# Patient Record
Sex: Female | Born: 1970 | Race: White | Hispanic: No | Marital: Single | Smoking: Never smoker
Health system: Southern US, Community
[De-identification: ages and names within clinical notes are randomized; demographics above are authoritative.]

## PROBLEM LIST (undated history)

## (undated) DIAGNOSIS — D682 Hereditary deficiency of other clotting factors: Secondary | ICD-10-CM

## (undated) DIAGNOSIS — G43909 Migraine, unspecified, not intractable, without status migrainosus: Secondary | ICD-10-CM

## (undated) HISTORY — PX: TUBAL LIGATION: SHX77

---

## 2010-03-26 ENCOUNTER — Emergency Department (HOSPITAL_COMMUNITY): Admission: EM | Admit: 2010-03-26 | Discharge: 2010-03-26 | Payer: Self-pay | Admitting: Emergency Medicine

## 2011-01-27 LAB — POCT CARDIAC MARKERS
Myoglobin, poc: 59.2 ng/mL (ref 12–200)
Troponin i, poc: <0.05 ng/mL (ref 0.00–0.09)

## 2011-05-27 ENCOUNTER — Emergency Department: Payer: Self-pay | Admitting: Emergency Medicine

## 2013-04-27 ENCOUNTER — Ambulatory Visit: Payer: Self-pay | Admitting: Family Medicine

## 2014-04-14 ENCOUNTER — Emergency Department: Payer: Self-pay | Admitting: Emergency Medicine

## 2014-04-26 ENCOUNTER — Emergency Department: Payer: Self-pay | Admitting: Emergency Medicine

## 2014-04-26 LAB — URINALYSIS, COMPLETE
Bilirubin,UR: NEGATIVE
Blood: NEGATIVE
Glucose,UR: NEGATIVE mg/dL (ref 0–75)
KETONE: NEGATIVE
Leukocyte Esterase: NEGATIVE
NITRITE: NEGATIVE
PH: 6 (ref 4.5–8.0)
Protein: NEGATIVE
SPECIFIC GRAVITY: 1.016 (ref 1.003–1.030)
Squamous Epithelial: 6

## 2014-04-26 LAB — COMPREHENSIVE METABOLIC PANEL
Albumin: 3.4 g/dL (ref 3.4–5.0)
Alkaline Phosphatase: 82 U/L
Anion Gap: 9 (ref 7–16)
BUN: 10 mg/dL (ref 7–18)
Bilirubin,Total: 1.9 mg/dL — ABNORMAL HIGH (ref 0.2–1.0)
CALCIUM: 8.5 mg/dL (ref 8.5–10.1)
CHLORIDE: 101 mmol/L (ref 98–107)
CO2: 26 mmol/L (ref 21–32)
CREATININE: 0.86 mg/dL (ref 0.60–1.30)
EGFR (Non-African Amer.): >60
GLUCOSE: 141 mg/dL — AB (ref 65–99)
Osmolality: 273 (ref 275–301)
POTASSIUM: 3.3 mmol/L — AB (ref 3.5–5.1)
SGOT(AST): 50 U/L — ABNORMAL HIGH (ref 15–37)
SGPT (ALT): 77 U/L (ref 12–78)
Sodium: 136 mmol/L (ref 136–145)
Total Protein: 7 g/dL (ref 6.4–8.2)

## 2014-04-26 LAB — CBC WITH DIFFERENTIAL/PLATELET
BASOS ABS: 0.1 10*3/uL (ref 0.0–0.1)
Basophil %: 0.8 %
Eosinophil #: 0.1 10*3/uL (ref 0.0–0.7)
Eosinophil %: 0.7 %
HCT: 41.2 % (ref 35.0–47.0)
HGB: 13.5 g/dL (ref 12.0–16.0)
Lymphocyte #: 1.9 10*3/uL (ref 1.0–3.6)
Lymphocyte %: 24.8 %
MCH: 30.1 pg (ref 26.0–34.0)
MCHC: 32.8 g/dL (ref 32.0–36.0)
MCV: 92 fL (ref 80–100)
MONO ABS: 0.5 x10 3/mm (ref 0.2–0.9)
Monocyte %: 6 %
NEUTROS ABS: 5.2 10*3/uL (ref 1.4–6.5)
Neutrophil %: 67.7 %
PLATELETS: 195 10*3/uL (ref 150–440)
RBC: 4.5 10*6/uL (ref 3.80–5.20)
RDW: 14.2 % (ref 11.5–14.5)
WBC: 7.7 10*3/uL (ref 3.6–11.0)

## 2014-04-26 LAB — PREGNANCY, URINE: Pregnancy Test, Urine: NEGATIVE m[IU]/mL

## 2014-04-27 LAB — CBC WITH DIFFERENTIAL/PLATELET
BASOS ABS: 0.1 10*3/uL (ref 0.0–0.1)
Basophil %: 1.8 %
EOS PCT: 0.2 %
Eosinophil #: 0 10*3/uL (ref 0.0–0.7)
HCT: 36.9 % (ref 35.0–47.0)
HGB: 12.7 g/dL (ref 12.0–16.0)
LYMPHS PCT: 39.8 %
Lymphocyte #: 2.9 10*3/uL (ref 1.0–3.6)
MCH: 31.2 pg (ref 26.0–34.0)
MCHC: 34.4 g/dL (ref 32.0–36.0)
MCV: 91 fL (ref 80–100)
MONO ABS: 0.3 x10 3/mm (ref 0.2–0.9)
Monocyte %: 4.4 %
NEUTROS PCT: 53.8 %
Neutrophil #: 3.9 10*3/uL (ref 1.4–6.5)
Platelet: 161 10*3/uL (ref 150–440)
RBC: 4.06 10*6/uL (ref 3.80–5.20)
RDW: 14.3 % (ref 11.5–14.5)
WBC: 7.3 10*3/uL (ref 3.6–11.0)

## 2014-04-28 ENCOUNTER — Observation Stay: Payer: Self-pay | Admitting: Internal Medicine

## 2014-04-28 LAB — MONONUCLEOSIS SCREEN: MONO TEST: NEGATIVE

## 2014-04-28 LAB — CSF CELL CT + PROT + GLU PANEL
CSF TUBE #: 3
Eosinophil: 0 %
GLUCOSE, CSF: 68 mg/dL (ref 40–75)
Lymphocytes: 0 %
Monocytes/Macrophages: 0 %
Neutrophils: 100 %
Other Cells: 0 %
PROTEIN, CSF: 34 mg/dL (ref 15–45)
RBC (CSF): 0 /mm3
WBC (CSF): 3 /mm3

## 2014-04-28 LAB — HEMOGLOBIN A1C: Hemoglobin A1C: 5.6 % (ref 4.2–6.3)

## 2014-04-28 LAB — COMPREHENSIVE METABOLIC PANEL
ALT: 124 U/L — AB (ref 12–78)
ANION GAP: 7 (ref 7–16)
Albumin: 3.2 g/dL — ABNORMAL LOW (ref 3.4–5.0)
Alkaline Phosphatase: 98 U/L
BUN: 7 mg/dL (ref 7–18)
Bilirubin,Total: 2.4 mg/dL — ABNORMAL HIGH (ref 0.2–1.0)
CO2: 26 mmol/L (ref 21–32)
CREATININE: 0.78 mg/dL (ref 0.60–1.30)
Calcium, Total: 8.1 mg/dL — ABNORMAL LOW (ref 8.5–10.1)
Chloride: 105 mmol/L (ref 98–107)
EGFR (African American): >60
EGFR (Non-African Amer.): >60
GLUCOSE: 102 mg/dL — AB (ref 65–99)
Osmolality: 274 (ref 275–301)
POTASSIUM: 3.7 mmol/L (ref 3.5–5.1)
SGOT(AST): 102 U/L — ABNORMAL HIGH (ref 15–37)
SODIUM: 138 mmol/L (ref 136–145)
TOTAL PROTEIN: 6.4 g/dL (ref 6.4–8.2)

## 2014-04-28 LAB — LIPASE, BLOOD: Lipase: 108 U/L (ref 73–393)

## 2014-04-28 LAB — URINE CULTURE

## 2014-04-28 LAB — RAPID HIV-1/2 QL/CONFIRM: HIV-1/2,Rapid Ql: NEGATIVE

## 2014-04-29 LAB — BASIC METABOLIC PANEL
Anion Gap: 7 (ref 7–16)
BUN: 6 mg/dL — ABNORMAL LOW (ref 7–18)
Calcium, Total: 8.2 mg/dL — ABNORMAL LOW (ref 8.5–10.1)
Chloride: 103 mmol/L (ref 98–107)
Co2: 28 mmol/L (ref 21–32)
Creatinine: 0.72 mg/dL (ref 0.60–1.30)
EGFR (African American): >60
EGFR (Non-African Amer.): >60
Glucose: 112 mg/dL — ABNORMAL HIGH (ref 65–99)
Osmolality: 274 (ref 275–301)
Potassium: 3.7 mmol/L (ref 3.5–5.1)
Sodium: 138 mmol/L (ref 136–145)

## 2014-04-29 LAB — CBC WITH DIFFERENTIAL/PLATELET
COMMENT - H1-COM1: NORMAL
EOS PCT: 1 %
HCT: 36.1 % (ref 35.0–47.0)
HGB: 12.2 g/dL (ref 12.0–16.0)
LYMPHS PCT: 31 %
MCH: 31.1 pg (ref 26.0–34.0)
MCHC: 33.9 g/dL (ref 32.0–36.0)
MCV: 92 fL (ref 80–100)
MONOS PCT: 3 %
PLATELETS: 152 10*3/uL (ref 150–440)
RBC: 3.93 10*6/uL (ref 3.80–5.20)
RDW: 14.6 % — ABNORMAL HIGH (ref 11.5–14.5)
Segmented Neutrophils: 49 %
VARIANT LYMPHOCYTE - H1-RLYMPH: 16 %
WBC: 7.9 10*3/uL (ref 3.6–11.0)

## 2014-04-29 LAB — HEPATIC FUNCTION PANEL A (ARMC)
Albumin: 3 g/dL — ABNORMAL LOW (ref 3.4–5.0)
Alkaline Phosphatase: 110 U/L
Bilirubin, Direct: 0.3 mg/dL — ABNORMAL HIGH (ref 0.00–0.20)
Bilirubin,Total: 1.3 mg/dL — ABNORMAL HIGH (ref 0.2–1.0)
SGOT(AST): 151 U/L — ABNORMAL HIGH (ref 15–37)
SGPT (ALT): 170 U/L — ABNORMAL HIGH (ref 12–78)
Total Protein: 6 g/dL — ABNORMAL LOW (ref 6.4–8.2)

## 2014-04-29 LAB — VANCOMYCIN, TROUGH: Vancomycin, Trough: 8 ug/mL — ABNORMAL LOW (ref 10–20)

## 2014-04-29 LAB — BETA STREP CULTURE(ARMC)

## 2014-05-01 LAB — CSF CULTURE W GRAM STAIN

## 2014-05-01 LAB — CULTURE, BLOOD (SINGLE)

## 2014-05-08 ENCOUNTER — Emergency Department: Payer: Self-pay | Admitting: Emergency Medicine

## 2014-05-08 LAB — CBC WITH DIFFERENTIAL/PLATELET
BASOS PCT: 0.5 %
Basophil #: 0 10*3/uL (ref 0.0–0.1)
EOS PCT: 1 %
Eosinophil #: 0.1 10*3/uL (ref 0.0–0.7)
HCT: 33.9 % — ABNORMAL LOW (ref 35.0–47.0)
HGB: 11 g/dL — ABNORMAL LOW (ref 12.0–16.0)
LYMPHS ABS: 7.1 10*3/uL — AB (ref 1.0–3.6)
LYMPHS PCT: 67 %
MCH: 29.9 pg (ref 26.0–34.0)
MCHC: 32.4 g/dL (ref 32.0–36.0)
MCV: 92 fL (ref 80–100)
MONOS PCT: 7.5 %
Monocyte #: 0.8 x10 3/mm (ref 0.2–0.9)
NEUTROS ABS: 2.5 10*3/uL (ref 1.4–6.5)
PLATELETS: 242 10*3/uL (ref 150–440)
RBC: 3.67 10*6/uL — ABNORMAL LOW (ref 3.80–5.20)
RDW: 15.4 % — ABNORMAL HIGH (ref 11.5–14.5)
WBC: 10.6 10*3/uL (ref 3.6–11.0)

## 2014-05-08 LAB — BASIC METABOLIC PANEL
Anion Gap: 4 — ABNORMAL LOW (ref 7–16)
BUN: 8 mg/dL (ref 7–18)
CALCIUM: 8.2 mg/dL — AB (ref 8.5–10.1)
CO2: 26 mmol/L (ref 21–32)
Chloride: 106 mmol/L (ref 98–107)
Creatinine: 0.65 mg/dL (ref 0.60–1.30)
EGFR (African American): >60
GLUCOSE: 94 mg/dL (ref 65–99)
OSMOLALITY: 270 (ref 275–301)
Potassium: 4.9 mmol/L (ref 3.5–5.1)
Sodium: 136 mmol/L (ref 136–145)

## 2014-06-02 ENCOUNTER — Ambulatory Visit: Payer: Self-pay | Admitting: Oncology

## 2014-10-27 ENCOUNTER — Ambulatory Visit: Payer: Self-pay | Admitting: Family Medicine

## 2014-12-13 ENCOUNTER — Ambulatory Visit: Payer: Self-pay | Admitting: Family Medicine

## 2015-03-03 NOTE — H&P (Signed)
PATIENT NAME:  Sophia Reeves, Sophia Reeves MR#:  [id number] DATE OF BIRTH:  07/08/1971  DATE OF ADMISSION:  04/27/2014  PRIMARY CARE PHYSICIAN: Angus Palms, MD  REFERRING PHYSICIAN: Enedina Finner. Manson Passey, MD  CHIEF COMPLAINT: Fever.   HISTORY OF PRESENT ILLNESS: The patient is a 44 year old morbidly obese female with no other significant medical history, who is presenting to the ED with a chief complaint of 102 degrees fever for the past 2 days. In fact, the patient was evaluated by ED physician yesterday for the same complaint, and she had blood cultures and urine cultures done at that time. Her white count is normal yesterday as well as today. Blood cultures and urine cultures as well as throat culture, which were obtained yesterday, have revealed no growth so far. Yesterday, the patient was diagnosed with dehydration, and she was discharged home. As she is still spiking fever of 102 degrees Fahrenheit associated with some headache, nausea and body cramps, the patient came back to the ED again. The patient is reporting that she has been having fever every  other day since June 5th. Until 2 days ago, she was having fever as high as 99 to 101 degrees Fahrenheit, but for the past 2 days, she was having 102 degrees Fahrenheit, which is not going away. The patient denies any sick contacts. No recent travel. No bug bites. No tick bites. No other complaints. Not exposed to anyone with tuberculosis. Denies any travel out of the country. In the ED, the patient had LP done which shows normal glucose and protein. Gram stain is pending at this time. The patient's lactic acid was elevated at 2.1 yesterday, which is normal today. CAT scan of the head without contrast has revealed no acute findings. Chest x-ray, PA and lateral views, also no active cardiopulmonary disease. The patient was started on empiric antibiotics, Rocephin and vancomycin, after LP was done by the ED physician. During my examination, the patient is resting  comfortably. Husband is at bedside. No similar complaints in the past.   PAST MEDICAL HISTORY: Morbidly obese.   PAST SURGICAL HISTORY: None.   ALLERGIES: No known drug allergies.   PSYCHOSOCIAL HISTORY: Lives at home with husband. No history of smoking, alcohol or illicit drug usage.   MEDICATIONS: Not on any home medications.   FAMILY HISTORY: Dad has a heart condition, and mother has history of diabetes mellitus.  REVIEW OF SYSTEMS:  CONSTITUTIONAL: Complaining of fever since June 5th. Fatigue and weakness.  EYES: Denies blurry vision, double vision, glaucoma, redness.  ENT: Denies epistaxis, discharge, tinnitus.  RESPIRATION: Denies cough, COPD. Denies any cold, sneezing.  CARDIOVASCULAR: No chest pain, palpitations, dizziness or syncope.  GASTROINTESTINAL: Complaining of nausea, intermittent episodes of vomiting. Denies any abdominal pain.  GENITOURINARY: No dysuria, hematuria, renal calculus or frequency.  GYNECOLOGIC AND BREASTS: Denies breast mass or vaginal discharge.  ENDOCRINE: Denies polyuria, nocturia, thyroid problems, increased sweating, heat or cold intolerance.  HEMATOLOGIC AND LYMPHATIC: No anemia, easy bruising or bleeding.  INTEGUMENTARY: No acne, rash, lesions.  MUSCULOSKELETAL: Complaining of muscle aches and body pains. Denies any swelling, gout. Denies any arthritis. Is complaining of neck pain, back pain, headache.  NEUROLOGIC: Denies vertigo, ataxia, dementia, dysarthria. PSYCHIATRIC: No ADD, OCD, insomnia.   PHYSICAL EXAMINATION:  VITAL SIGNS: Temperature 99.9, pulse initially 131, but subsequently it went down to 76, blood pressure 108/68, pulse oximetry is 96%.  GENERAL APPEARANCE: Not under acute distress. Moderately built and obese.  HEENT: Normocephalic, atraumatic. Pupils are equally reacting to light and  accommodation. No scleral icterus. No conjunctival injection. No sinus tenderness. Moist mucous membranes. No postnasal drip.  NECK: Supple. No  JVD. Range of motion is intact. No neck stiffness. No meningeal signs.  LUNGS: Clear to auscultation bilaterally. No accessory muscle usage. No anterior chest wall tenderness on palpation.  CARDIAC: S1, S2 normal. Regular rate and rhythm. No murmurs, gallops or clicks.  GASTROINTESTINAL: Soft, obese. Bowel sounds are positive in all 4 quadrants. Nontender, nondistended. No hepatosplenomegaly. No masses felt.  NEUROLOGIC: Awake, alert, oriented x3. Cranial nerves II through XII are grossly intact. Motor and sensory are intact. Reflexes are 2+.  EXTREMITIES: No edema. No cyanosis. No clubbing.  SKIN: Warm to touch. Normal turgor. No rashes. No lesions.  MUSCULOSKELETAL: No joint effusion, tenderness, erythema.  PSYCHIATRIC: Normal mood and affect.   LABORATORIES AND IMAGING STUDIES:  Chest x-ray, PA and lateral views: No active cardiopulmonary disease.  CT head, which was done yesterday on June 18th without contrast as the patient was complaining of headache, is unremarkable.  Lactic acid was at 2.1 on June 17th, yesterday it was at 0.8, and today it is at 1.0.  Glucose 102. The rest of the Chem-8 is normal except calcium at 8.1.  Lipase is normal.  LFTs: Albumin is at 3.2, total bilirubin is 2.4, AST at 102, ALT 124.  CBC is normal.  CSF: Glucose 68, protein 34.  Microbiology: Blood cultures have revealed no growth so far. These blood cultures were obtained from June 17th. Urine culture obtained from June 17th: No growth.  Beta strep culture is negative.  Urine pregnancy test is negative.  Mono test is negative.  Urinalysis: Yellow in color, clear in appearance, nitrites and leukocyte esterase are negative, glucose and bilirubin are negative.  EKG done on June 17th has  revealed sinus tachycardia, incomplete right bundle branch block, nonspecific ST-T wave changes.   ASSESSMENT AND PLAN: A 44 year old Caucasian female presenting to the ED with a chief complaint of persistent fever every  other day starting from June 5th, but for the past 2 days, it was high-grade at 102 degrees Fahrenheit and continuous, associated with myalgias, nausea, chills and rigors. Will be admitted with the following assessment and plan.   1. Fever of unknown origin. Pan-cultures were obtained which were negative so far. Status post lumbar puncture; glucose and protein look normal, Gram stain is pending. The patient is started on empiric antibiotics with IV Rocephin and vancomycin. ID consult is placed. Tylenol will be given as needed for fever.  2. Morbid obesity. Once the patient is clinically stable, will be benefitted with diet and exercise. 3. Will provide gastrointestinal and deep vein thrombosis prophylaxis.   CODE STATUS: She is full code. Husband is the medical power of attorney.   Plan of care discussed with the patient. She is aware of the plan.   TOTAL TIME SPENT ON ADMISSION: 45 minutes.    ____________________________ Ramonita LabAruna Gouru, MD ag:lb D: 04/28/2014 05:55:33 ET T: 04/28/2014 06:18:33 ET JOB#: [id number]  cc: Ramonita LabAruna Gouru, MD, <Dictator> Angus PalmsSionne George, MD Ramonita LabARUNA GOURU MD ELECTRONICALLY SIGNED 04/30/2014 0:58

## 2015-03-03 NOTE — Discharge Summary (Signed)
PATIENT NAME:  Sophia Reeves, Sophia Reeves MR#:  [id number] DATE OF BIRTH:  [birth year]  DATE OF ADMISSION:  04/28/2014  DATE OF DISCHARGE:  04/30/2014  ADMITTING DIAGNOSIS: Fever of unknown origin.  DISCHARGE DIAGNOSIS:   1. Fever of unknown origin, suspected tick fever. Serologies pending. 2. Hepatitis, likely due to same. 3. Hyperglycemia, hemoglobin A1c is 5.6, known diabetes. 4. Headache due to fever.  5.  Obesity.   DISCHARGE CONDITION: Stable.   DISCHARGE MEDICATIONS: The patient is to continue acetaminophen/hydrocodone 325/5 mg once every 4 to 6 hours as needed, doxycycline 100 mg twice daily for 14 more days.   DISCHARGE CONDITION:  Stable.  INSTRUCTIONS:  Oxygen:  None.  Diet:  1 gram salt, low fat, low cholesterol. Patient was advised to continue a low calorie diet. The patient was advised to lose weight, if possible. Diet consistency:  Regular. Activity limitations: As tolerated. Follow up appointment with Dr.  Sampson Goon in 2 days after discharge, Dr. Angus Palms 2 days after discharge.     CONSULTANTS: Care Management, social work, Dr. Sampson Goon.   RADIOLOGIC STUDIES: Chest x-ray PA and lateral 04/26/2014 showed no active cardiopulmonary disease. CT of abdomen and pelvis with contrast 04/26/2014 was normal. CT of head without contrast 04/27/2014 was normal. Ultrasound of right upper quadrant 04/29/2014 was normal.   REASON FOR ADMISSION: The patient is a 44 year old Caucasian female with no significant past medical history, who presents to the hospital with complaints of a 2-week history of fevers as well as a 2-day history of headaches. Please refer to Dr. Rob Hickman admission note on 04/28/2014. On arrival to the hospital, patient's temperature was 99.9; however, was reported as  high as 102 at home, pulse was 131, blood pressure 108/68, saturation was 96% on room air.  Physical examination was unremarkable.   LAB DATA: Done in the Emergency Room 04/26/2014 revealed a glucose level of  141, potassium 3.3, otherwise BMP was normal. The patient's liver enzymes revealed a total bilirubin of 1.9, AST 50. CBC: White blood cell count was 7.7, hemoglobin was 13.5, platelet count 195. Absolute neutrophil count was normal at 5.2. Blood cultures taken on 04/26/2014 did not show any growth. Urinalysis was unremarkable. Pregnancy test was negative. Mono test was negative. HIV test was negative. Lactic acid level was elevated at 2.1. EKG done on the day of admission June 17 showed sinus tach at 127 beats per minute, incomplete right bundle branch block, questionable anterior infarct, age undetermined.   HOSPITAL COURSE:  The patient was admitted to the hospital for further evaluation. She had a chest x-ray done, which was normal. Because of unknown source of fever, she underwent also LP on the day of admission. It showed, however, no growth. It revealed 3 white blood cells,  100% of them were neutrophils. Glucose level was 68, protein level was 74, which were within normal limits.  Strep cultures were negative. Urine culture also showed no growth.  The patient was admitted to the hospital, started on broad-spectrum antibiotic therapy, including doxycycline. Serology for Lyme disease , ehrlichia as well as [facility] spotted fever were also taken. The patient was consulted by Dr. Sampson Goon. Dr. Sampson Goon saw patient in consultation the same day, on 04/28/2014. He recommended to check HIV and CMV PCR, continue doxycycline and check [facility] spotted fever serologies. The patient did relatively well. Her fever subsided with doxycycline. On the day of discharge, her temperature was 98.8, pulse was ranging above 90s to 100s, respiration was 17 to 18, blood pressure  113/69, saturation was 95% to 97% on room air at rest. The patient was advised to continue antibiotic therapy with doxycycline for the next 14 days, and follow up with Dr. Sampson GoonFitzgerald in the next few days after discharge for further  recommendations.   In regards to hepatitis, patient's liver enzymes were minimally elevated on the day of admission. However, it seems to be that liver enzymes somewhat worsen as time progressed, with increasing AST as well as ALT levels to 151 and 170, respectively. The patient's total bilirubin  level was 1.3, 0.3 of them were direct bilirubin on the same day, 04/29/2014. CT scan of abdomen and pelvis was performed as well as ultrasound of right upper quadrant due to worsening liver enzymes. However, no significant changes were noted. It was felt that patient's liver enzyme elevation was very likely due to this questionable viral versus rickettsial disease.   In regards to headaches, it was felt to be that patient's headaches were fever-related and decided with pain medications. LP was performed, which did not show any growth, but also was unremarkable. The patient was noted to be hyperglycemic in the hospital. Hemoglobin A1c was checked, and was found to be 5.6.  In regards to obesity, patient was advised to lose weight.   The patient is being discharged in stable condition with the above-mentioned medications and followup.   TIME SPENT: 40 minutes.   ____________________________ Katharina Caperima Vaickute, MD rv:mr D: 04/30/2014 18:46:00 ET T: 04/30/2014 19:16:00 ET JOB#: [id number]  cc: Angus PalmsSionne George, MD Katharina Caperima Vaickute, MD, <Dictator>   RIMA VAICKUTE MD ELECTRONICALLY SIGNED 05/06/2014 19:36

## 2015-03-03 NOTE — Consult Note (Signed)
PATIENT NAME:  Sophia Reeves, Sophia Reeves MR#:  [id number] DATE OF BIRTH:  [birth year]  DATE OF CONSULTATION:  04/28/2014  REFERRING PHYSICIAN:  Nicholes Mango, MD CONSULTING PHYSICIAN:  Cheral Marker. Ola Spurr, MD  REASON FOR CONSULTATION: Fevers of unknown origin.   HISTORY OF PRESENT ILLNESS: This is a 44 year old obese female without much significant past medical history. She has been complaining of fevers and feeling poorly since June 2. She has been having increasing symptoms and then developed some nausea, vomiting. She developed headache and body cramps and came to the Emergency Room. She has not been losing any weight, having a cough, or having any diarrhea. She has some abdominal discomfort, but no severe pain. No urinary symptoms or back pain. She has not had any marked outdoor exposure or camping or hiking. She does have a pet dog but no other animals. She is married and lives with her husband. She does have children, but they are grown. She does not have any sick contacts recently.   In the Emergency Room, the patient had imaging as well as a lumbar puncture, all of which are unrevealing. Blood cultures are pending. We are consulted for further evaluation and treatment.   PAST MEDICAL HISTORY: Obesity.   PAST SURGICAL HISTORY: None.   ALLERGIES: No known drug allergies.   SOCIAL HISTORY: Lives at home with her husband. Has 2 grown children. No smoking, alcohol, or drugs. She is unemployed. She does have a pet dog. No recent travel. No travel outside of the [place]. No history of TB contacts and no sick contacts.   MEDICATIONS: No home medications.   FAMILY HISTORY: Father with heart disease. Mother with diabetes.   ANTIBIOTICS SINCE ADMISSION: Include ceftriaxone, doxycycline, and vancomycin.   PHYSICAL EXAMINATION: VITAL SIGNS: Temperature 100.3, pulse 84, blood pressure 107/74, respirations 18, sat 97% on room air.  GENERAL: She is pleasant, interactive, in no acute distress. She is  obese.  HEENT: Pupils equal, reactive to light and accommodation. Extraocular movements are intact. Sclerae anicteric. Oropharynx clear.  NECK: Supple. There is no anterior cervical, posterior cervical, or supraclavicular lymphadenopathy.  HEART: Regular.  LUNGS: Clear.  ABDOMEN: Soft, nontender, nondistended. No hepatosplenomegaly.  EXTREMITIES: No clubbing, cyanosis, or edema.  MUSCULOSKELETAL: No joint pain or swelling.  SKIN: No peripheral stigmata of endocarditis. No rash. No lesions.   LABORATORY AND RADIOLOGICAL DATA: Blood cultures x 2 are pending. CSF cultures are negative. CSF showed 3 white cells, 100% of which are neutrophils, protein was 34, glucose 68. CT of the head is negative. Mono test is negative. On the CT scan, the sinuses were normal. Lipase is normal. Comprehensive panel shows elevated AST of 102, ALT of 124, T-bili 2.4, calcium 8.1; otherwise, C-met was normal. White count 7.3, hemoglobin 12.7, platelets 161. Lactic acid negative. CT of the abdomen and pelvis with contrast showed no acute abnormalities. A strep culture was done and is negative. Chest x-ray showed no acute cardiopulmonary disease. Urinalysis done on the 17th showed no white cells, and urine culture is mixed. Blood cultures, June 17, are no growth to date.   IMPRESSION: A 44 year old with 2 weeks of fever and some headache and nausea. Nonspecific findings. The only abnormality is her slight increase in her AST and ALT. HIV test is negative.   RECOMMENDATIONS: 1.  Check HIV PCR to exclude acute HIV infection.  2.  Check CMV PCR, as this can cause a  mono-like illness with elevated LFTs.  3.  Continue doxycycline and check [facility]  spotted fever serologies.  4.  If the patient remains clinically stable, blood cultures are negative from the 17th as well as from admission, she would be stable for discharge home with outpatient followup on oral doxycycline. I can see her in 43 to 43 weeks in clinic.   Thank  you for the consult. I will be glad to follow with you.  ____________________________ Cheral Marker. Ola Spurr, MD dpf:jcm D: 04/28/2014 18:36:32 ET T: 04/28/2014 20:27:18 ET JOB#: [id number]  cc: Cheral Marker. Ola Spurr, MD, <Dictator> DAVID Ola Spurr MD ELECTRONICALLY SIGNED 05/04/2014 13:27

## 2015-06-05 ENCOUNTER — Observation Stay: Payer: BLUE CROSS/BLUE SHIELD

## 2015-06-05 ENCOUNTER — Encounter: Payer: Self-pay | Admitting: Internal Medicine

## 2015-06-05 ENCOUNTER — Emergency Department: Payer: BLUE CROSS/BLUE SHIELD

## 2015-06-05 ENCOUNTER — Observation Stay
Admit: 2015-06-05 | Discharge: 2015-06-05 | Disposition: A | Discharge status: Home / Self Care | Payer: BLUE CROSS/BLUE SHIELD | Attending: Internal Medicine | Admitting: Internal Medicine

## 2015-06-05 ENCOUNTER — Observation Stay
Admission: EM | Admit: 2015-06-05 | Discharge: 2015-06-06 | Disposition: A | Discharge status: Home / Self Care | Payer: BLUE CROSS/BLUE SHIELD | Attending: Internal Medicine | Admitting: Internal Medicine

## 2015-06-05 DIAGNOSIS — G43909 Migraine, unspecified, not intractable, without status migrainosus: Secondary | ICD-10-CM | POA: Insufficient documentation

## 2015-06-05 DIAGNOSIS — R200 Anesthesia of skin: Secondary | ICD-10-CM | POA: Diagnosis not present

## 2015-06-05 DIAGNOSIS — I6330 Cerebral infarction due to thrombosis of unspecified cerebral artery: Secondary | ICD-10-CM | POA: Insufficient documentation

## 2015-06-05 DIAGNOSIS — E785 Hyperlipidemia, unspecified: Secondary | ICD-10-CM | POA: Diagnosis not present

## 2015-06-05 DIAGNOSIS — I6523 Occlusion and stenosis of bilateral carotid arteries: Secondary | ICD-10-CM | POA: Insufficient documentation

## 2015-06-05 DIAGNOSIS — I639 Cerebral infarction, unspecified: Secondary | ICD-10-CM

## 2015-06-05 DIAGNOSIS — Z8249 Family history of ischemic heart disease and other diseases of the circulatory system: Secondary | ICD-10-CM | POA: Insufficient documentation

## 2015-06-05 DIAGNOSIS — G459 Transient cerebral ischemic attack, unspecified: Secondary | ICD-10-CM | POA: Diagnosis not present

## 2015-06-05 DIAGNOSIS — R202 Paresthesia of skin: Secondary | ICD-10-CM | POA: Diagnosis present

## 2015-06-05 DIAGNOSIS — D682 Hereditary deficiency of other clotting factors: Secondary | ICD-10-CM | POA: Diagnosis not present

## 2015-06-05 DIAGNOSIS — D6851 Activated protein C resistance: Secondary | ICD-10-CM | POA: Diagnosis not present

## 2015-06-05 DIAGNOSIS — Z7982 Long term (current) use of aspirin: Secondary | ICD-10-CM | POA: Insufficient documentation

## 2015-06-05 DIAGNOSIS — Z833 Family history of diabetes mellitus: Secondary | ICD-10-CM | POA: Diagnosis not present

## 2015-06-05 DIAGNOSIS — R531 Weakness: Secondary | ICD-10-CM | POA: Diagnosis not present

## 2015-06-05 DIAGNOSIS — R51 Headache: Secondary | ICD-10-CM | POA: Diagnosis present

## 2015-06-05 DIAGNOSIS — R519 Headache, unspecified: Secondary | ICD-10-CM

## 2015-06-05 DIAGNOSIS — Z820 Family history of epilepsy and other diseases of the nervous system: Secondary | ICD-10-CM | POA: Insufficient documentation

## 2015-06-05 HISTORY — DX: Hereditary deficiency of other clotting factors: D68.2

## 2015-06-05 HISTORY — DX: Migraine, unspecified, not intractable, without status migrainosus: G43.909

## 2015-06-05 LAB — COMPREHENSIVE METABOLIC PANEL
ALT: 20 U/L (ref 14–54)
AST: 28 U/L (ref 15–41)
Albumin: 4.2 g/dL (ref 3.5–5.0)
Alkaline Phosphatase: 61 U/L (ref 38–126)
Anion gap: 8 (ref 5–15)
BILIRUBIN TOTAL: 1.6 mg/dL — AB (ref 0.3–1.2)
BUN: 11 mg/dL (ref 6–20)
CALCIUM: 8.8 mg/dL — AB (ref 8.9–10.3)
CO2: 23 mmol/L (ref 22–32)
Chloride: 107 mmol/L (ref 101–111)
Creatinine, Ser: 0.71 mg/dL (ref 0.44–1.00)
GFR calc Af Amer: >60 mL/min (ref >60)
GLUCOSE: 157 mg/dL — AB (ref 65–99)
Potassium: 3.5 mmol/L (ref 3.5–5.1)
Sodium: 138 mmol/L (ref 135–145)
Total Protein: 7.4 g/dL (ref 6.5–8.1)

## 2015-06-05 LAB — DIFFERENTIAL
Basophils Absolute: 0 10*3/uL (ref 0–0.1)
Basophils Relative: 1 %
EOS ABS: 0.1 10*3/uL (ref 0–0.7)
EOS PCT: 2 %
Lymphocytes Relative: 35 %
Lymphs Abs: 2.6 10*3/uL (ref 1.0–3.6)
MONO ABS: 0.5 10*3/uL (ref 0.2–0.9)
Monocytes Relative: 6 %
NEUTROS ABS: 4.2 10*3/uL (ref 1.4–6.5)
NEUTROS PCT: 56 %

## 2015-06-05 LAB — PROTIME-INR
INR: 0.98
PROTHROMBIN TIME: 13.2 s (ref 11.4–15.0)

## 2015-06-05 LAB — CBC
HCT: 40.9 % (ref 35.0–47.0)
HEMOGLOBIN: 13.9 g/dL (ref 12.0–16.0)
MCH: 31.3 pg (ref 26.0–34.0)
MCHC: 33.9 g/dL (ref 32.0–36.0)
MCV: 92.3 fL (ref 80.0–100.0)
Platelets: 261 10*3/uL (ref 150–440)
RBC: 4.43 MIL/uL (ref 3.80–5.20)
RDW: 13.7 % (ref 11.5–14.5)
WBC: 7.4 10*3/uL (ref 3.6–11.0)

## 2015-06-05 LAB — APTT: APTT: 32 s (ref 24–36)

## 2015-06-05 MED ORDER — ASPIRIN 325 MG PO TABS
325.0000 mg | ORAL_TABLET | Freq: Every day | ORAL | Status: DC
  Administered 2015-06-05 – 2015-06-06 (×2): 325 mg via ORAL
  Filled 2015-06-05 (×2): qty 1

## 2015-06-05 MED ORDER — STROKE: EARLY STAGES OF RECOVERY BOOK
Freq: Once | Status: AC
  Administered 2015-06-05: 16:00:00

## 2015-06-05 MED ORDER — ASPIRIN 300 MG RE SUPP: 300.0000 mg | Freq: Every day | RECTAL | Status: DC

## 2015-06-05 MED ORDER — SENNOSIDES-DOCUSATE SODIUM 8.6-50 MG PO TABS: 1.0000 | ORAL_TABLET | Freq: Every evening | ORAL | Status: DC | PRN

## 2015-06-05 MED ORDER — OXYCODONE-ACETAMINOPHEN 5-325 MG PO TABS: 1.0000 | ORAL_TABLET | ORAL | Status: DC | PRN

## 2015-06-05 MED ORDER — PROCHLORPERAZINE EDISYLATE 5 MG/ML IJ SOLN
10.0000 mg | Freq: Four times a day (QID) | INTRAMUSCULAR | Status: DC | PRN
  Administered 2015-06-05: 10 mg via INTRAVENOUS
  Filled 2015-06-05 (×2): qty 2

## 2015-06-05 MED ORDER — BUTALBITAL-APAP-CAFFEINE 50-325-40 MG PO TABS
1.0000 | ORAL_TABLET | ORAL | Status: DC | PRN
  Administered 2015-06-05: 1 via ORAL
  Filled 2015-06-05: qty 1

## 2015-06-05 MED ORDER — MAGNESIUM SULFATE IN D5W 10-5 MG/ML-% IV SOLN
1.0000 g | Freq: Once | INTRAVENOUS | Status: AC
  Administered 2015-06-05: 18:00:00 1 g via INTRAVENOUS
  Filled 2015-06-05 (×2): qty 100

## 2015-06-05 MED ORDER — ENOXAPARIN SODIUM 40 MG/0.4ML ~~LOC~~ SOLN
40.0000 mg | SUBCUTANEOUS | Status: DC
  Administered 2015-06-05: 16:00:00 40 mg via SUBCUTANEOUS
  Filled 2015-06-05: qty 0.4

## 2015-06-05 MED ORDER — SODIUM CHLORIDE 0.9 % IV BOLUS (SEPSIS)
1000.0000 mL | Freq: Once | INTRAVENOUS | Status: AC
  Administered 2015-06-05: 1000 mL via INTRAVENOUS

## 2015-06-05 MED ORDER — DIPHENHYDRAMINE HCL 50 MG/ML IJ SOLN
12.5000 mg | Freq: Once | INTRAMUSCULAR | Status: AC
Start: 2015-06-05 — End: 2015-06-05
  Administered 2015-06-05: 12.5 mg via INTRAVENOUS
  Filled 2015-06-05: qty 1

## 2015-06-05 MED ORDER — KETOROLAC TROMETHAMINE 30 MG/ML IJ SOLN
30.0000 mg | Freq: Once | INTRAMUSCULAR | Status: AC
  Administered 2015-06-05: 30 mg via INTRAVENOUS
  Filled 2015-06-05: qty 1

## 2015-06-05 MED ORDER — IOHEXOL 240 MG/ML SOLN: 25.0000 mL | Freq: Once | INTRAMUSCULAR | Status: AC | PRN

## 2015-06-05 NOTE — ED Notes (Signed)
States began R arm numbness x 30 min, grip weaker

## 2015-06-05 NOTE — H&P (Signed)
[facility] - [place] at [facility]   PATIENT NAME: Sophia Reeves    MR#:  [id number]  DATE OF BIRTH:  Jan 14, 1971  DATE OF ADMISSION:  06/05/2015  PRIMARY CARE PHYSICIAN: Rayetta Humphrey, MD   REQUESTING/REFERRING PHYSICIAN: Derrill Kay M.D.  CHIEF COMPLAINT:   Chief Complaint  Patient presents with  . Code Stroke    R arm tingling x 30 min, grip weaker    HISTORY OF PRESENT ILLNESS: Sophia Reeves  is a 44 y.o. 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 It went up to her elbow. She also had some lower lip numbness. Associated with this she had a severe headache. Due to this she came to the emergency room she had a CT scan of the head which was negative. Her hand numbness is improved. She was seen by neurology who recommended overnight admission and a TIA/stroke workup.  PAST MEDICAL HISTORY:   Past Medical History  Diagnosis Date  . Migraine   . Factor V deficiency     PAST SURGICAL HISTORY:  Past Surgical History  Procedure Laterality Date  . Tubal ligation      SOCIAL HISTORY:  History  Substance Use Topics  . Smoking status: Never Smoker   . Smokeless tobacco: Not on file  . Alcohol Use: No    FAMILY HISTORY:  Family History  Problem Relation Age of Onset  . CAD Father   . Diabetes Mother     DRUG ALLERGIES: No Known Allergies  REVIEW OF SYSTEMS:   CONSTITUTIONAL: No fever, fatigue or weakness.  EYES: No blurred or double vision.  EARS, NOSE, AND THROAT: No tinnitus or ear pain.  RESPIRATORY: No cough, shortness of breath, wheezing or hemoptysis.  CARDIOVASCULAR: No chest pain, orthopnea, edema.  GASTROINTESTINAL: No nausea, vomiting, diarrhea or abdominal pain.  GENITOURINARY: No dysuria, hematuria.  ENDOCRINE: No polyuria, nocturia,  HEMATOLOGY: No anemia, easy bruising or bleeding SKIN: No  rash or lesion. MUSCULOSKELETAL: No joint pain or arthritis.   NEUROLOGIC: Tingling in the right hand.  PSYCHIATRY: No anxiety or depression.   MEDICATIONS AT HOME:  Prior to Admission medications   Not on File      PHYSICAL EXAMINATION:   VITAL SIGNS: Blood pressure 106/64, pulse 95, temperature 98.4 F (36.9 C), temperature source Oral, resp. rate 18, height  (1.6 m), weight 93.441 kg (206 lb), SpO2 98 %.  GENERAL:  44 y.o.-year-old patient lying in the bed with no acute distress.  EYES: Pupils equal, round, reactive to light and accommodation. No scleral icterus. Extraocular muscles intact.  HEENT: Head atraumatic, normocephalic. Oropharynx and nasopharynx clear.  NECK:  Supple, no jugular venous distention. No thyroid enlargement, no tenderness.  LUNGS: Normal breath sounds bilaterally, no wheezing, rales,rhonchi or crepitation. No use of accessory muscles of respiration.  CARDIOVASCULAR: S1, S2 normal. No murmurs, rubs, or gallops.  ABDOMEN: Soft, nontender, nondistended. Bowel sounds present. No organomegaly or mass.  EXTREMITIES: No pedal edema, cyanosis, or clubbing.  NEUROLOGIC: Cranial nerves II through XII are intact. Muscle strength 5/5 in all extremities. Sensation intact. Gait not checked.  PSYCHIATRIC: The patient is alert and oriented x 3.  SKIN: No obvious rash, lesion, or ulcer.   LABORATORY PANEL:   CBC  Recent Labs Lab 06/05/15 1048  WBC 7.4  HGB 13.9  HCT 40.9  PLT 261  MCV 92.3  MCH 31.3  MCHC 33.9  RDW 13.7  LYMPHSABS 2.6  MONOABS 0.5  EOSABS 0.1  BASOSABS 0.0   ------------------------------------------------------------------------------------------------------------------  Chemistries   Recent Labs Lab 06/05/15 1048  NA 138  K 3.5  CL 107  CO2 23  GLUCOSE 157*  BUN 11  CREATININE 0.71  CALCIUM 8.8*  AST 28  ALT 20  ALKPHOS 61  BILITOT 1.6*    ------------------------------------------------------------------------------------------------------------------ estimated creatinine clearance is 98.5 mL/min (by C-G formula based on Cr of 0.71). ------------------------------------------------------------------------------------------------------------------ No results for input(s): TSH, T4TOTAL, T3FREE, THYROIDAB in the last 72 hours.  Invalid input(s): FREET3   Coagulation profile  Recent Labs Lab 06/05/15 1048  INR 0.98   ------------------------------------------------------------------------------------------------------------------- No results for input(s): DDIMER in the last 72 hours. -------------------------------------------------------------------------------------------------------------------  Cardiac Enzymes No results for input(s): CKMB, TROPONINI, MYOGLOBIN in the last 168 hours.  Invalid input(s): CK ------------------------------------------------------------------------------------------------------------------ Invalid input(s): POCBNP  ---------------------------------------------------------------------------------------------------------------  Urinalysis    Component Value Date/Time   COLORURINE Yellow 04/26/2014 2004   APPEARANCEUR Clear 04/26/2014 2004   LABSPEC 1.016 04/26/2014 2004   PHURINE 6.0 04/26/2014 2004   GLUCOSEU Negative 04/26/2014 2004   HGBUR Negative 04/26/2014 2004   BILIRUBINUR Negative 04/26/2014 2004   KETONESUR Negative 04/26/2014 2004   PROTEINUR Negative 04/26/2014 2004   NITRITE Negative 04/26/2014 2004   LEUKOCYTESUR Negative 04/26/2014 2004     RADIOLOGY: Ct Head Wo Contrast  06/05/2015   CLINICAL DATA:  RIGHT arm weakness and numbness. Lip numbness. Code stroke.  EXAM: CT HEAD WITHOUT CONTRAST  TECHNIQUE: Contiguous axial images were obtained from the base of the skull through the vertex without intravenous contrast.  COMPARISON:  04/27/2014 head CT.  FINDINGS:  No mass lesion, mass effect, midline shift, hydrocephalus, hemorrhage. No territorial ischemia or acute infarction. Mild motion artifact is present through the middle and posterior cranial fossa. Visible paranasal sinuses are clear.  IMPRESSION: Negative CT head. Critical Value/emergent results were called by telephone at the time of interpretation on 06/05/2015 at 10:34 am to Dr. Jene Every , who verbally acknowledged these results.   Electronically Signed   By: Andreas Newport M.D.   On: 06/05/2015 10:35    EKG: Orders placed or performed during the hospital encounter of 06/05/15  . ED EKG  . 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 Right hand numbness and a headache likely due to complex migraine however with her history of factor V Leyden deficiency we'll go ahead and admit patient under observation obtain MRI of the brain. Continue aspirin artery seen by neurology. If her MRI is positive then we'll need to consider anticoagulation with Coumadin.  2. Migraine when necessary pain medications  3. Miscellaneous Lovenox for DVT prophylaxis    All the records are reviewed and case discussed with ED provider. Management plans discussed with the patient, family and they are in agreement.  CODE STATUS: Full    TOTAL TIME TAKING CARE OF THIS PATIENT: 45 minutes.    Auburn Bilberry M.D on 06/05/2015 at 12:50 PM  Between 7am to 6pm - Pager - [phone]  After 6pm go to [email or web address] - password [email or web address] [facility]  [place] [facility]  Office  [phone]  CC: Primary care physician; Rayetta Humphrey, MD
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
## 2015-06-05 NOTE — Plan of Care (Signed)
Problem: Discharge/Transitional Outcomes Goal: Other Discharge Outcomes/Goals Outcome: Progressing Plan of Progress to Goal: 1. Barriers:         A x O. No Barriers Noted. 2. Educational Plan:         Stroke: Mapping our Early Stages of Recovery Book provided. 3. Hemodynamically Stable:        VSS.         Labs WDL.         Aspirin  orally daily. 4. Independent Mobility/functioning independent or with min:         Denies Dizziness.         OOB to Bathroom with Standby Assist. Tolerated Well. 5. Diet:         Tolerating Regular Diet. 6. INR Monitor Plan Established:         PT INR WDL.         Lovenox  SQ Daily.         Aspirin  Orally Daily.

## 2015-06-05 NOTE — Progress Notes (Signed)
*  PRELIMINARY RESULTS* Echocardiogram 2D Echocardiogram has been performed.  Garrel Ridgel Stills 06/05/2015, 4:05 PM

## 2015-06-05 NOTE — Consult Note (Signed)
Reason for Consult: stroke Referring Physician: Dr. Randalyn Rhea is an 44 y.o. female.  HPI: seen at request of Dr. Nikki Dom for stroke;  44 yo RHD F presents to [facility] with R hand tingling and some R facial tingling as well as headache.  Pt has hx of migraines since age 30 and this is similar.  Pt does have some photophobia as well as N/V.  Pt denies ever having numbness like this episode.  Pt also denies ever having any weakness or aura with her headaches.  Pt denies malaise currently.  PMH:  Migraines, Factor V Leiden  No past surgical history on file.  PSH:  none No family history on file.   Family Hx:  Mother with migraines, no stroke  Social History:  has no tobacco, alcohol, and drug history on file.  Allergies: Allergies not on file  Medications: none  Results for orders placed or performed during the hospital encounter of 06/05/15 (from the past 48 hour(s))  Protime-INR     Status: None   Collection Time: 06/05/15 10:48 AM  Result Value Ref Range   Prothrombin Time 13.2 11.4 - 15.0 seconds   INR 0.98   APTT     Status: None   Collection Time: 06/05/15 10:48 AM  Result Value Ref Range   aPTT 32 24 - 36 seconds  CBC     Status: None   Collection Time: 06/05/15 10:48 AM  Result Value Ref Range   WBC 7.4 3.6 - 11.0 K/uL   RBC 4.43 3.80 - 5.20 MIL/uL   Hemoglobin 13.9 12.0 - 16.0 g/dL   HCT 40.9 35.0 - 47.0 %   MCV 92.3 80.0 - 100.0 fL   MCH 31.3 26.0 - 34.0 pg   MCHC 33.9 32.0 - 36.0 g/dL   RDW 13.7 11.5 - 14.5 %   Platelets 261 150 - 440 K/uL  Differential     Status: None   Collection Time: 06/05/15 10:48 AM  Result Value Ref Range   Neutrophils Relative % 56 %   Neutro Abs 4.2 1.4 - 6.5 K/uL   Lymphocytes Relative 35 %   Lymphs Abs 2.6 1.0 - 3.6 K/uL   Monocytes Relative 6 %   Monocytes Absolute 0.5 0.2 - 0.9 K/uL   Eosinophils Relative 2 %   Eosinophils Absolute 0.1 0 - 0.7 K/uL   Basophils Relative 1 %   Basophils Absolute 0.0 0 - 0.1 K/uL   Comprehensive metabolic panel     Status: Abnormal   Collection Time: 06/05/15 10:48 AM  Result Value Ref Range   Sodium 138 135 - 145 mmol/L   Potassium 3.5 3.5 - 5.1 mmol/L   Chloride 107 101 - 111 mmol/L   CO2 23 22 - 32 mmol/L   Glucose, Bld 157 (H) 65 - 99 mg/dL   BUN 11 6 - 20 mg/dL   Creatinine, Ser 0.71 0.44 - 1.00 mg/dL   Calcium 8.8 (L) 8.9 - 10.3 mg/dL   Total Protein 7.4 6.5 - 8.1 g/dL   Albumin 4.2 3.5 - 5.0 g/dL   AST 28 15 - 41 U/L   ALT 20 14 - 54 U/L   Alkaline Phosphatase 61 38 - 126 U/L   Total Bilirubin 1.6 (H) 0.3 - 1.2 mg/dL   GFR calc non Af Amer >60 >60 mL/min   GFR calc Af Amer >60 >60 mL/min    Comment: (NOTE) The eGFR has been calculated using the CKD EPI equation. This calculation has  not been validated in all clinical situations. eGFR's persistently <60 mL/min signify possible Chronic Kidney Disease.    Anion gap 8 5 - 15    Ct Head Wo Contrast  06/05/2015   CLINICAL DATA:  RIGHT arm weakness and numbness. Lip numbness. Code stroke.  EXAM: CT HEAD WITHOUT CONTRAST  TECHNIQUE: Contiguous axial images were obtained from the base of the skull through the vertex without intravenous contrast.  COMPARISON:  04/27/2014 head CT.  FINDINGS: No mass lesion, mass effect, midline shift, hydrocephalus, hemorrhage. No territorial ischemia or acute infarction. Mild motion artifact is present through the middle and posterior cranial fossa. Visible paranasal sinuses are clear.  IMPRESSION: Negative CT head. Critical Value/emergent results were called by telephone at the time of interpretation on 06/05/2015 at 10:34 am to Dr. Lavonia Drafts , who verbally acknowledged these results.   Electronically Signed   By: Dereck Ligas M.D.   On: 06/05/2015 10:35    Review of Systems  Constitutional: Negative.   HENT: Negative for congestion, ear discharge, ear pain, hearing loss, nosebleeds and tinnitus.   Eyes: Positive for photophobia. Negative for blurred vision, double  vision, pain and discharge.  Respiratory: Negative.   Cardiovascular: Negative.   Gastrointestinal: Negative.   Genitourinary: Negative.   Musculoskeletal: Negative.   Skin: Negative.   Neurological: Positive for sensory change and headaches. Negative for dizziness, tingling, tremors, speech change, focal weakness, seizures and loss of consciousness.   Blood pressure 106/64, pulse 95, temperature 98.4 F (36.9 C), temperature source Oral, resp. rate 18, height _0  (1.6 m), weight 93.441 kg (206 lb), SpO2 98 %. Physical Exam  Constitutional: She appears well-developed and well-nourished. No distress.  HENT:  Head: Normocephalic and atraumatic.  Nose: Nose normal.  Mouth/Throat: Oropharynx is clear and moist.  Eyes: EOM are normal. Pupils are equal, round, and reactive to light. No scleral icterus.  Neck: Normal range of motion. Neck supple.  Cardiovascular: Normal rate, regular rhythm and normal heart sounds.   No murmur heard. Respiratory: Effort normal and breath sounds normal.  GI: Soft. Bowel sounds are normal. She exhibits no distension.  Musculoskeletal: Normal range of motion.  Neurological:  Alert and oriented x 3, nl speech and language PERRLA, EOMI, nl VF, face symmetric, tongue midline 5/5 B except R UE drift, nl tone FTN and HTS WNL 2+/4 B, withdrawal plantars B Intact sensory to pinprick, temp and vibration  NIHSS 1   CT of head personally reviewed by me and is normal  Assessment/Plan: 1.  Probable complicated migraine-  Pt has no risk factors for stroke except for Factor V Leiden which usually causes venous clots which are not typically associated with stroke.  Pt is currently having a headache which goes more with above.  Can not rule out seizure either. -  tPA was discussed with pt but believe that risk outweigh benefits  -  MRI of brain w/o contrast -  toradol 24m IV, magnesium sulfate 1gm, and benadryl 12.575mIV now for headache -  PRN fioricet -  Start  ASA 8167maily -  Avoid triptans and DHE -  Will follow but pt can go home in am if MRI is neg and headache is less than 3 -  [facility]  Emric Kowalewski 06/05/2015, 12:38 PM

## 2015-06-05 NOTE — ED Notes (Signed)
Pt taken to [place], will coordinate to transfer pt to floor

## 2015-06-06 ENCOUNTER — Observation Stay: Payer: BLUE CROSS/BLUE SHIELD

## 2015-06-06 DIAGNOSIS — I6330 Cerebral infarction due to thrombosis of unspecified cerebral artery: Secondary | ICD-10-CM | POA: Insufficient documentation

## 2015-06-06 LAB — LIPID PANEL
CHOL/HDL RATIO: 4 ratio
CHOLESTEROL: 173 mg/dL (ref 0–200)
HDL: 43 mg/dL (ref >40)
LDL Cholesterol: 112 mg/dL — ABNORMAL HIGH (ref 0–99)
Triglycerides: 92 mg/dL (ref <150)
VLDL: 18 mg/dL (ref 0–40)

## 2015-06-06 LAB — HEMOGLOBIN A1C: Hgb A1c MFr Bld: 5 % (ref 4.0–6.0)

## 2015-06-06 NOTE — Progress Notes (Signed)
MD making rounds. Discharge orders received. IV removed. No prescriptions. Physician appointment made by secretary. Discharge paperwork provided, explained, signed and witnessed. No unanswered questions. Escorted via wheelchair by Scientist, research (physical sciences). All belongings sent with patient and family.

## 2015-06-06 NOTE — Progress Notes (Signed)
PT Cancellation Note  Patient Details Name: Sophia Reeves MRN: [id number] DOB: 09/27/1971   Cancelled Treatment:    Reason Eval/Treat Not Completed: Other (comment) (Consult received and chart reviewed. Per discussion with primary RN, patient has ruled out for neurological pathology; admitted with complex migraine.  Symptoms resolving and patient currently indep with all mobility in room and throughout unit (presently up in bathroom indep).  No safety concerns; no PT needs reported.  Will discontinue initial order; please re-consult should needs change.)   Dannie Hattabaugh H. Manson Passey, PT, DPT, NCS 06/06/2015, 8:53 AM [phone]

## 2015-06-06 NOTE — Plan of Care (Signed)
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  Plan of Progress to Goal: 1. Barriers:  Patient A&O x 4.  Neuro checks performed every two hours until 01:00 then Q4 after.  No barriers noted this shift.  Stroke assessment performed this shift with patient score of 0. 2. Education: Stroke: Mapping our Early Stages of Recovery Book provided during day shift.  Patient questions about understanding stroke and risk factors answered this shift. 3. Hemodynamic:  VSS this shift. BP 117/75 mmHg  Pulse 96  Temp(Src) 98.2 F (36.8 C) (Oral)  Resp 18  Ht  (1.6 m)  Wt 206 lb (93.441 kg)  BMI 36.50 kg/m2  SpO2 98%  LMP 05/10/2015 (Within Days) 4. Complications:  Patient with independent mobility and functioning.  She is up to bathroom with standby assist for assessment and fall prevent throughout shift.  Pt. denies dizziness. 5. Diet:  Patient on regular diet and tolerating well. 6. Discharge: INR Monitor Plan Established - patient on Lovenox  daily and 325 mg Aspirin daily.

## 2015-06-06 NOTE — Progress Notes (Signed)
Speech Therapy Note: Received order and reviewed chart; consulted NSG re: pt's status. Met w/ pt who denied any trouble swallowing or any trouble w/ her speech/language abilities. Pt conversed easily w/ SLP w/ no apparent language deficits noted. Pt ate breakfast meal this am w/ no reported deficits. Pt does not appear to need an evaluation at this time as she is at her baseline; pt agreed. NSG updated. ST will sign off at this time. Please reconsult if nec.

## 2015-06-06 NOTE — Discharge Summary (Signed)
Sophia Reeves, is a 44 y.o. female  DOB Sophia Reeves  MRN [id number].  Admission date:  06/05/2015  Admitting Physician  Auburn Bilberry, MD  Discharge Date:  06/06/2015   Primary MD  Rayetta Humphrey, MD  Recommendations for primary care physician for things to follow:  Follow up with PMD in one week   Admission Diagnosis  TIA (transient ischemic attack) [G45.9] Numbness and tingling in right hand [R20.2] Headache [R51] Headache, unspecified headache type [R51]   Discharge Diagnosis  TIA (transient ischemic attack) [G45.9] Numbness and tingling in right hand [R20.2] Headache [R51] Headache, unspecified headache type [R51]   Active Problems:   Numbness and tingling in right hand   Cerebral thrombosis with cerebral infarction      Past Medical History  Diagnosis Date  . Migraine   . Factor V deficiency     Past Surgical History  Procedure Laterality Date  . Tubal ligation         History of present illness and  Hospital Course:     Kindly see H&P for history of present illness and admission details, please review complete Labs, Consult reports and Test reports for all details in brief  HPI  from the history and physical done on the day of admission  44 year old right-handed female admitted because of right hand tingling and right facial tingling and headaches. Patient admitted to observation status for possible stroke.  Hospital Course   1. Possible complicated migraine: Patient head CT is unremarkable MRI of the brain did not show any stroke seen by Dr. Mellody Drown from urology patient initially placed in observation because of her history of factor V Leiden deficiency. Patient facial tingling and right hand tingling resolved and she feels better today and wants to go home. Received Toradol,  magnesium,benadryl for  headache. Patient says that she takes aspirin at home advised to continue that. Hyperlipidemia LDL is more than 100 but that she has no risk factors advised her to have healthy diet and exercise and follow up with primary doctor in 3 months having repeat the lipid function checked.   Discharge Condition: stable   Follow UP  Follow-up Information    Follow up with Rayetta Humphrey, MD On 06/20/2015.   Specialty:  Family Medicine   Why:  Appointment scheduled @ 8:40 am.     Contact information:   [address] [place] [address] [phone]         Discharge Instructions  and  Discharge Medications       Medication List    Notice    You have not been prescribed any medications.        Diet and Activity recommendation: See Discharge Instructions above   Consults obtained - neurology   Major procedures and Radiology Reports - PLEASE review detailed and final reports for all details, in brief -     Ct Head Wo Contrast  06/06/2015   CLINICAL DATA:  Right hand numbness and arm weakness.  Headache.  EXAM: CT HEAD WITHOUT CONTRAST  TECHNIQUE: Contiguous axial images were obtained from the base of the skull through the vertex without intravenous contrast.  COMPARISON:  MRI 06/05/2015  FINDINGS: No acute intracranial abnormality. Specifically, no hemorrhage, hydrocephalus, mass lesion, acute infarction, or significant intracranial injury. No acute calvarial abnormality. Visualized paranasal sinuses and mastoids clear. Orbital soft tissues unremarkable.  IMPRESSION: Negative.   Electronically Signed   By: Charlett Nose M.D.   On: 06/06/2015 12:30  Ct Head Wo Contrast  06/05/2015   CLINICAL DATA:  RIGHT arm weakness and numbness. Lip numbness. Code stroke.  EXAM: CT HEAD WITHOUT CONTRAST  TECHNIQUE: Contiguous axial images were obtained from the base of the skull through the vertex without intravenous contrast.  COMPARISON:  04/27/2014 head CT.   FINDINGS: No mass lesion, mass effect, midline shift, hydrocephalus, hemorrhage. No territorial ischemia or acute infarction. Mild motion artifact is present through the middle and posterior cranial fossa. Visible paranasal sinuses are clear.  IMPRESSION: Negative CT head. Critical Value/emergent results were called by telephone at the time of interpretation on 06/05/2015 at 10:34 am to Dr. Jene Every , who verbally acknowledged these results.   Electronically Signed   By: Andreas Newport M.D.   On: 06/05/2015 10:35   Mr Brain Wo Contrast  06/05/2015   CLINICAL DATA:  Headache. RIGHT hand numbness. Migraines. Factor V deficiency.  EXAM: MRI HEAD WITHOUT CONTRAST  TECHNIQUE: Multiplanar, multiecho pulse sequences of the brain and surrounding structures were obtained without intravenous contrast.  COMPARISON:  CT head earlier today.  FINDINGS: No evidence for acute infarction, hemorrhage, mass lesion, hydrocephalus, or extra-axial fluid. Normal cerebral volume.  Mild to moderate subcortical and periventricular T2 and FLAIR hyperintensities, nonspecific. Considerations include complicated migraine, chronic microvascular ischemic change, vasculitis, chronic infection, or idiopathic. Demyelinating disease is not favored.  Flow voids are maintained throughout the carotid, basilar, and vertebral arteries. There are no areas of chronic hemorrhage. Pituitary, pineal, and cerebellar tonsils unremarkable. No upper cervical lesions.  IMPRESSION: Mild mild to moderate T2 and FLAIR hyperintensities throughout the subcortical and periventricular white matter are nonspecific. See discussion above.  No acute intracranial findings are evident.   Electronically Signed   By: Elsie Stain M.D.   On: 06/05/2015 15:15   US Carotid Bilateral  06/05/2015   CLINICAL DATA:  TIA.  Right on numbness.  EXAM: BILATERAL CAROTID DUPLEX ULTRASOUND  TECHNIQUE: Wallace Cullens scale imaging, color Doppler and duplex ultrasound were performed of  bilateral carotid and vertebral arteries in the neck.  COMPARISON:  None.  FINDINGS: Criteria: Quantification of carotid stenosis is based on velocity parameters that correlate the residual internal carotid diameter with NASCET-based stenosis levels, using the diameter of the distal internal carotid lumen as the denominator for stenosis measurement.  The following velocity measurements were obtained:  RIGHT  ICA:  84 cm/sec  CCA:  97 cm/sec  SYSTOLIC ICA/CCA RATIO:  0.9  DIASTOLIC ICA/CCA RATIO:  ECA:  87 cm/sec  LEFT  ICA:  71 cm/sec  CCA:  84 cm/sec  SYSTOLIC ICA/CCA RATIO:  0.8  DIASTOLIC ICA/CCA RATIO:  ECA:  82 cm/sec  RIGHT CAROTID ARTERY: Little if any plaque in the bulb. Low resistance internal carotid Doppler pattern.  RIGHT VERTEBRAL ARTERY:  Antegrade with a normal Doppler pattern.  LEFT CAROTID ARTERY: Little if any plaque in the bulb. Low resistance internal carotid Doppler pattern.  LEFT VERTEBRAL ARTERY:  Antegrade with a normal Doppler pattern.  IMPRESSION: Less than 50% stenosis in the right and left internal carotid arteries.   Electronically Signed   By: Jolaine Click M.D.   On: 06/05/2015 13:59    Micro Results    No results found for this or any previous visit (from the past 240 hour(s)).     Today   Subjective:   Letetia Romanello today has no headache,no chest abdominal pain,no new weakness tingling or numbness, feels much better wants to go home today  Objective:   Blood pressure  121/79, pulse 90, temperature 98.4 F (36.9 C), temperature source Oral, resp. rate 18, height  (1.6 m), weight 93.441 kg (206 lb), last menstrual period 05/10/2015, SpO2 99 %.   Intake/Output Summary (Last 24 hours) at 06/06/15 1233 Last data filed at 06/06/15 0859  Gross per 24 hour  Intake    460 ml  Output    751 ml  Net   -291 ml    Exam Awake Alert, Oriented x 3, No new F.N deficits, Normal affect [place].AT,PERRAL Supple Neck,No JVD, No cervical lymphadenopathy appriciated.   Symmetrical Chest wall movement, Good air movement bilaterally, CTAB RRR,No Gallops,Rubs or new Murmurs, No Parasternal Heave +ve B.Sounds, Abd Soft, Non tender, No organomegaly appriciated, No rebound -guarding or rigidity. No Cyanosis, Clubbing or edema, No new Rash or bruise  Data Review   CBC w Diff: Lab Results  Component Value Date   WBC 7.4 06/05/2015   WBC 10.6 05/08/2014   HGB 13.9 06/05/2015   HGB 11.0* 05/08/2014   HCT 40.9 06/05/2015   HCT 33.9* 05/08/2014   PLT 261 06/05/2015   PLT 242 05/08/2014   LYMPHOPCT 35 06/05/2015   LYMPHOPCT 67.0 05/08/2014   MONOPCT 6 06/05/2015   MONOPCT 7.5 05/08/2014   EOSPCT 2 06/05/2015   EOSPCT 1.0 05/08/2014   BASOPCT 1 06/05/2015   BASOPCT 0.5 05/08/2014    CMP: Lab Results  Component Value Date   NA 138 06/05/2015   NA 136 05/08/2014   K 3.5 06/05/2015   K 4.9 05/08/2014   CL 107 06/05/2015   CL 106 05/08/2014   CO2 23 06/05/2015   CO2 26 05/08/2014   BUN 11 06/05/2015   BUN 8 05/08/2014   CREATININE 0.71 06/05/2015   CREATININE 0.65 05/08/2014   PROT 7.4 06/05/2015   PROT 6.0* 04/29/2014   ALBUMIN 4.2 06/05/2015   ALBUMIN 3.0* 04/29/2014   BILITOT 1.6* 06/05/2015   BILITOT 1.3* 04/29/2014   ALKPHOS 61 06/05/2015   ALKPHOS 110 04/29/2014   AST 28 06/05/2015   AST 151* 04/29/2014   ALT 20 06/05/2015   ALT 170* 04/29/2014  .   Total Time in preparing paper work, data evaluation and todays exam - 35 minutes  Pierce Biagini M.D on 06/06/2015 at 12:33 PM

## 2015-06-09 ENCOUNTER — Encounter: Payer: Self-pay | Admitting: Emergency Medicine

## 2015-06-09 ENCOUNTER — Emergency Department: Payer: BLUE CROSS/BLUE SHIELD

## 2015-06-09 ENCOUNTER — Emergency Department
Admission: EM | Admit: 2015-06-09 | Discharge: 2015-06-09 | Disposition: A | Discharge status: Home / Self Care | Payer: BLUE CROSS/BLUE SHIELD | Attending: Emergency Medicine | Admitting: Emergency Medicine

## 2015-06-09 DIAGNOSIS — R29898 Other symptoms and signs involving the musculoskeletal system: Secondary | ICD-10-CM

## 2015-06-09 DIAGNOSIS — R202 Paresthesia of skin: Secondary | ICD-10-CM | POA: Diagnosis not present

## 2015-06-09 DIAGNOSIS — M6281 Muscle weakness (generalized): Secondary | ICD-10-CM | POA: Diagnosis not present

## 2015-06-09 DIAGNOSIS — R200 Anesthesia of skin: Secondary | ICD-10-CM | POA: Diagnosis present

## 2015-06-09 MED ORDER — TOPIRAMATE 50 MG PO TABS
50.0000 mg | ORAL_TABLET | Freq: Two times a day (BID) | ORAL | Status: AC
Start: 2015-06-09 — End: 2016-06-08

## 2015-06-09 MED ORDER — ASPIRIN 81 MG PO CHEW
324.0000 mg | CHEWABLE_TABLET | Freq: Once | ORAL | Status: AC
  Administered 2015-06-09: 243 mg via ORAL
  Filled 2015-06-09: qty 4

## 2015-06-09 MED ORDER — ASPIRIN EC 325 MG PO TBEC: 325.0000 mg | DELAYED_RELEASE_TABLET | Freq: Every day | ORAL | Status: AC

## 2015-06-09 NOTE — ED Notes (Signed)
Patient reports being at work and developed complete numbness to right arm. States shew as seen here last week for same. Was getting ready to lift coffee pot when incident occurred. Patient had MRI when previously seen. Denies any neck injuries in past.

## 2015-06-09 NOTE — ED Notes (Signed)
Pt informed to return if any life threatening symptoms occur.  

## 2015-06-09 NOTE — Discharge Instructions (Signed)

## 2015-06-09 NOTE — ED Provider Notes (Signed)
[facility] Emergency Department Provider Note     Time seen: ----------------------------------------- 12:59 PM on 06/09/2015 -----------------------------------------    I have reviewed the triage vital signs and the nursing notes.   HISTORY  Chief Complaint Numbness    HPI Sophia Reeves is a 44 y.o. female who presents ER for sudden onset of complete numbness and some weakness noted in the right upper extremity. Patient was seen here last week for the same thing, was admitted and had an MRI that was negative. Reportedly she was diagnosed with migraine. She is getting rid left coffeepot this morning when the event occurred. She is not having any headache currently, no other neurologic complaints.   Past Medical History  Diagnosis Date  . Migraine   . Factor V deficiency     Patient Active Problem List   Diagnosis Date Noted  . Cerebral thrombosis with cerebral infarction 06/06/2015  . Numbness and tingling in right hand 06/05/2015    Past Surgical History  Procedure Laterality Date  . Tubal ligation      Allergies Review of patient's allergies indicates no known allergies.  Social History History  Substance Use Topics  . Smoking status: Never Smoker   . Smokeless tobacco: Not on file  . Alcohol Use: No    Review of Systems Constitutional: Negative for fever. Eyes: Negative for visual changes. ENT: Negative for sore throat. Cardiovascular: Negative for chest pain. Respiratory: Negative for shortness of breath. Gastrointestinal: Negative for abdominal pain, vomiting and diarrhea. Genitourinary: Negative for dysuria. Musculoskeletal: Negative for back pain. Skin: Negative for rash. Neurological: Negative for headaches, decreased sensation in the right upper extremity, weakness in the right upper extremity  10-point ROS otherwise negative.  ____________________________________________   PHYSICAL EXAM:  VITAL SIGNS: ED  Triage Vitals  Enc Vitals Group     BP 06/09/15 1249 105/90 mmHg     Pulse Rate 06/09/15 1249 90     Resp 06/09/15 1249 20     Temp 06/09/15 1249 98.1 F (36.7 C)     Temp Source 06/09/15 1249 Oral     SpO2 06/09/15 1249 97 %     Weight 06/09/15 1249 200 lb (90.719 kg)     Height 06/09/15 1249  (1.6 m)     Head Cir --      Peak Flow --      Pain Score 06/09/15 1250 0     Pain Loc --      Pain Edu? --      Excl. in GC? --     Constitutional: Alert and oriented. Well appearing and in no distress. Eyes: Conjunctivae are normal. PERRL. Normal extraocular movements. ENT   Head: Normocephalic and atraumatic.   Nose: No congestion/rhinnorhea.   Mouth/Throat: Mucous membranes are moist.   Neck: No stridor. Cardiovascular: Normal rate, regular rhythm. Normal and symmetric distal pulses are present in all extremities. No murmurs, rubs, or gallops. Respiratory: Normal respiratory effort without tachypnea nor retractions. Breath sounds are clear and equal bilaterally. No wheezes/rales/rhonchi. Gastrointestinal: Soft and nontender. No distention. No abdominal bruits. There is no CVA tenderness. Musculoskeletal: Nontender with normal range of motion in all extremities. No joint effusions.  No lower extremity tenderness nor edema. Neurologic:  Normal speech and language. There is right upper extremity pronator drift, right upper extremity decreased sensation and decreased intrinsic hand strength in the right upper extremity Skin:  Skin is warm, dry and intact. No rash noted. Psychiatric: Mood and affect are normal. Speech  and behavior are normal. Patient exhibits appropriate insight and judgment.  ____________________________________________  ED COURSE:  Pertinent labs & imaging results that were available during my care of the patient were reviewed by me and considered in my medical decision making (see chart for details). We'll review recent hospitalization and  MRI. ____________________________________________  IMPRESSION: Normal head CT.  FINAL ASSESSMENT AND PLAN  Numbness, weakness  Plan: Patient with labs and imaging as dictated above. Discussed the case with Dr. Katrinka Blazing neurology who recommended just increasing her aspirin and increasing her Topamax to 50 mg twice a day. She's had a negative CT scan on repeat here, negative MRI, CT and ultrasound the last 4 days.   Emily Filbert, MD   Emily Filbert, MD 06/09/15 [phone]

## 2016-07-08 ENCOUNTER — Encounter: Payer: Self-pay | Admitting: Emergency Medicine

## 2016-07-08 ENCOUNTER — Emergency Department
Admission: EM | Admit: 2016-07-08 | Discharge: 2016-07-08 | Disposition: A | Discharge status: Home / Self Care | Payer: BLUE CROSS/BLUE SHIELD | Attending: Emergency Medicine | Admitting: Emergency Medicine

## 2016-07-08 DIAGNOSIS — R05 Cough: Secondary | ICD-10-CM | POA: Diagnosis present

## 2016-07-08 DIAGNOSIS — Z5321 Procedure and treatment not carried out due to patient leaving prior to being seen by health care provider: Secondary | ICD-10-CM | POA: Insufficient documentation

## 2016-07-08 NOTE — ED Triage Notes (Signed)
Patient ambulatory to triage with steady gait, without difficulty or distress noted; pt reports since this am having nonprod cough and chills with HA

## 2016-07-08 NOTE — ED Notes (Signed)
No answer when called from lobby 

## 2016-08-21 IMAGING — MR MR HEAD W/O CM
10 series · 48 of 48 positions shown · non-contrast
Comparison: CT head earlier today.

CLINICAL DATA: Headache. RIGHT hand numbness. Migraines. Factor V
deficiency.

EXAM:
MRI HEAD WITHOUT CONTRAST
TECHNIQUE: Multiplanar, multiecho pulse sequences of the brain and surrounding
structures were obtained without intravenous contrast.

[Series 2: T1 · sagittal · 5.0mm · 0.45mm/px · 3 of 27 slices shown (1 of 2)]
[im 1/27]
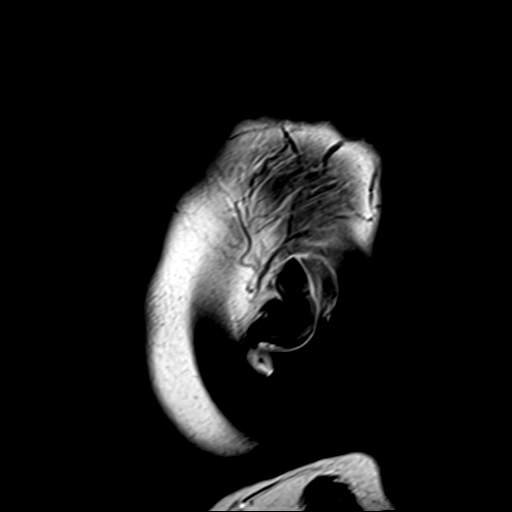
[im 14/27]
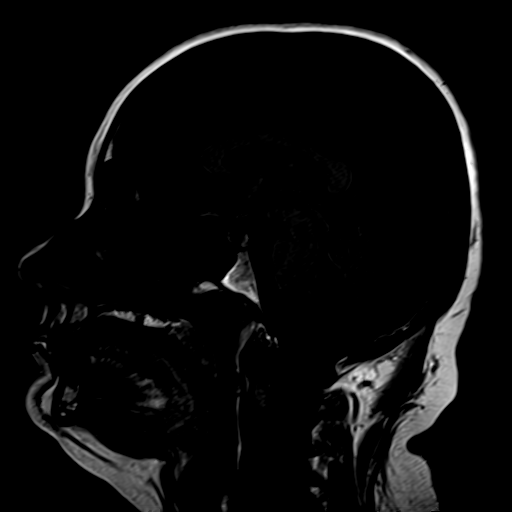
[im 27/27]
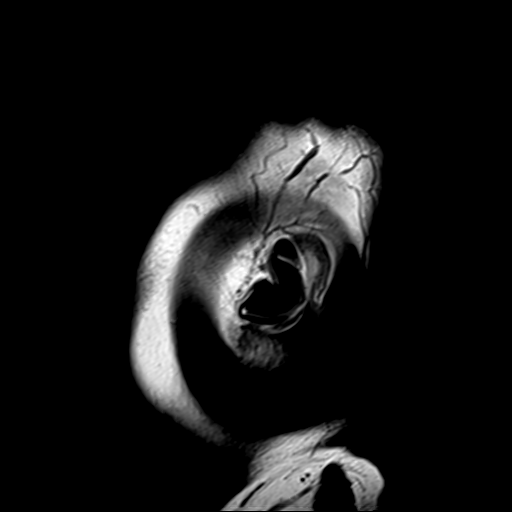

[Series 4: DWI · axial · 3.0mm · 1.80mm/px · z∈[-54,+106]mm · 7 of 55 slices shown (1 of 4)]
[im 1/55]
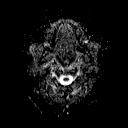
[im 10/55]
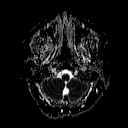
[im 19/55]
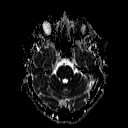
[im 28/55]
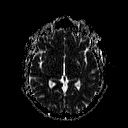
[im 37/55]
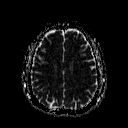
[im 46/55]
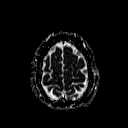
[im 55/55]
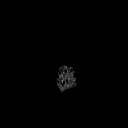

[Series 6: DWI · coronal · 3.0mm · 1.80mm/px · 6 of 49 slices shown (2 of 4)]
[im 1/49]
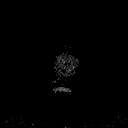
[im 10/49]
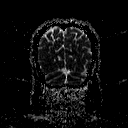
[im 20/49]
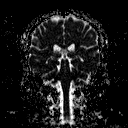
[im 29/49]
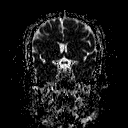
[im 39/49]
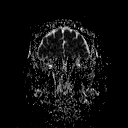
[im 49/49]
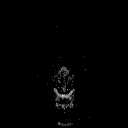

[Series 7: T2 · axial · 5.0mm · 0.60mm/px · z∈[-49,+106]mm · 3 of 25 slices shown (1 of 3)]
[im 1/25]
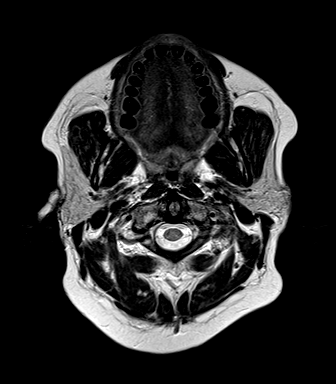
[im 13/25]
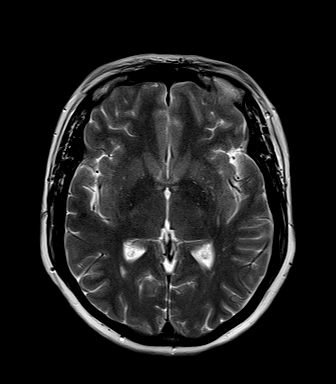
[im 25/25]
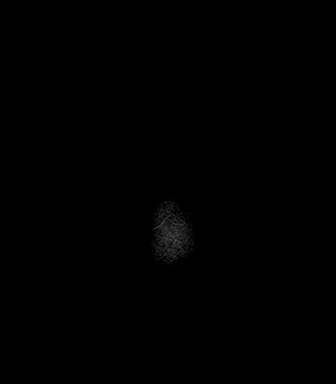

[Series 8: FLAIR · axial · 5.0mm · 0.45mm/px · z∈[-49,+106]mm · 3 of 25 slices shown]
[im 1/25]
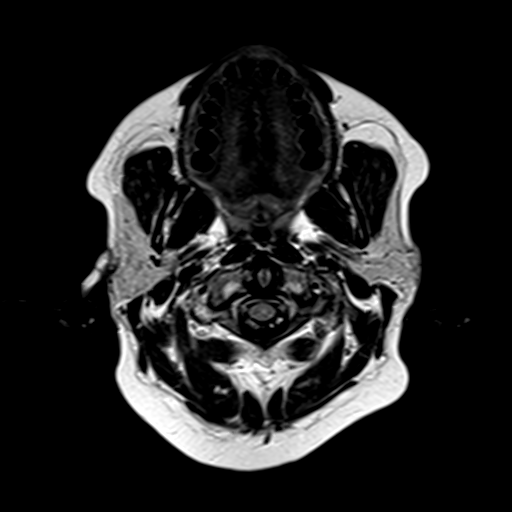
[im 13/25]
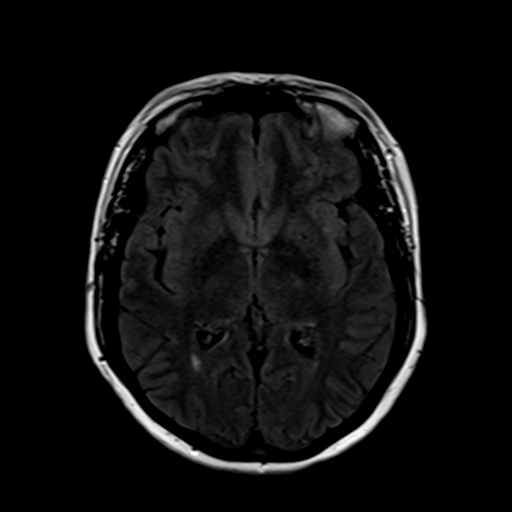
[im 25/25]
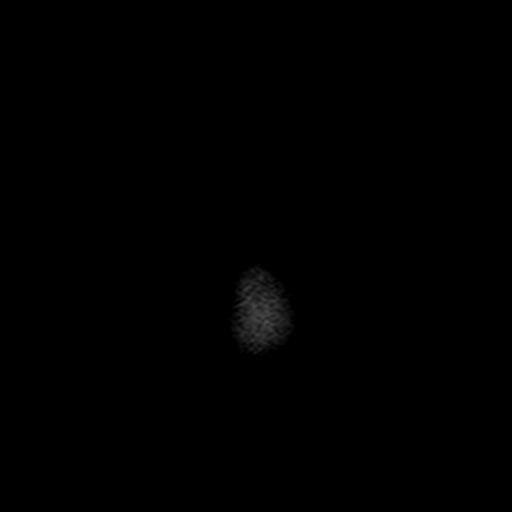

[Series 9: T2 · axial · 5.0mm · 0.45mm/px · z∈[-49,+106]mm · 3 of 25 slices shown (2 of 3)]
[im 1/25]
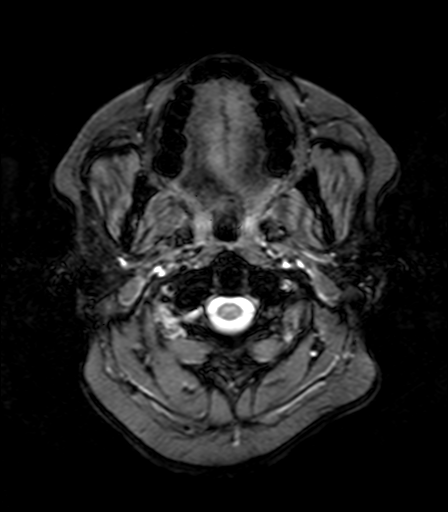
[im 13/25]
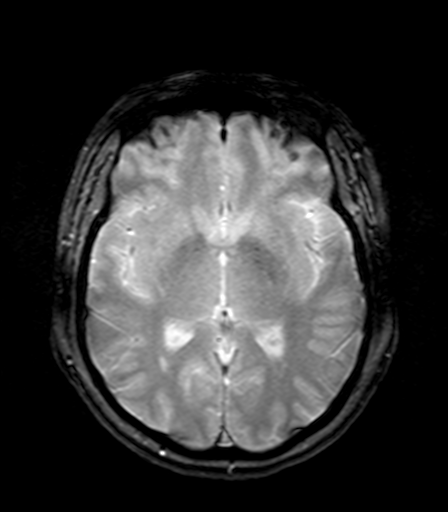
[im 25/25]
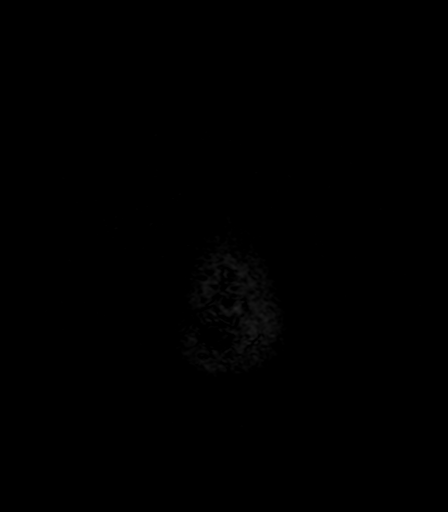

[Series 10: T1 · axial · 3.0mm · 1.00mm/px · z∈[-57,+118]mm · 7 of 60 slices shown (2 of 2)]
[im 1/60]
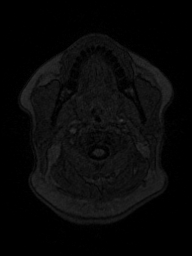
[im 10/60]
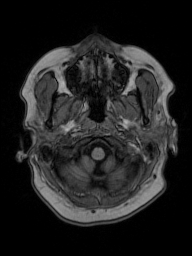
[im 20/60]
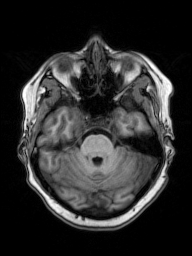
[im 30/60]
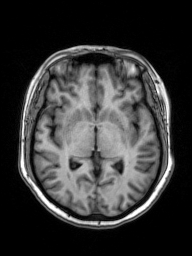
[im 40/60]
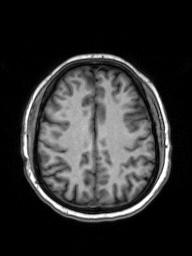
[im 50/60]
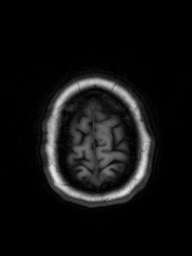
[im 60/60]
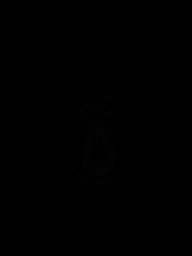

[Series 11: T2 · coronal · 5.0mm · 0.57mm/px · 3 of 29 slices shown (3 of 3)]
[im 1/29]
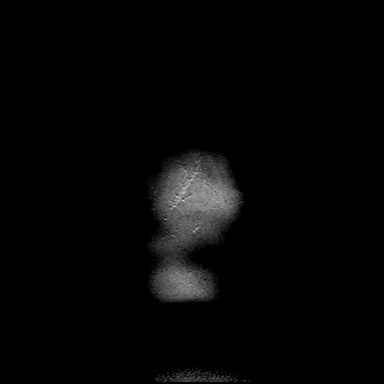
[im 15/29]
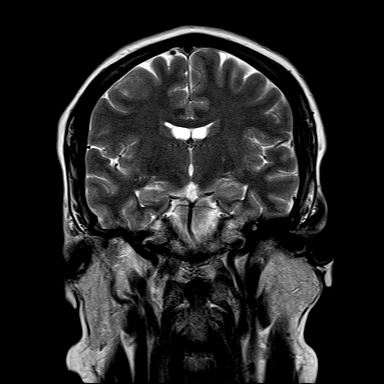
[im 29/29]
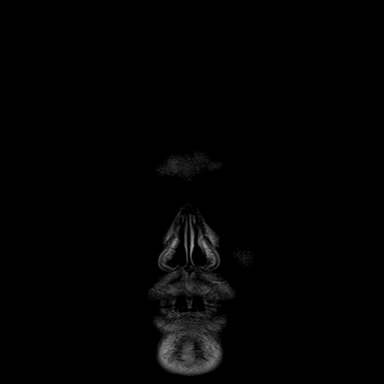

[Series 100: DWI · axial · 3.0mm · 1.80mm/px · z∈[-54,+106]mm · 7 of 55 slices shown (3 of 4)]
[im 1/55]
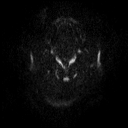
[im 10/55]
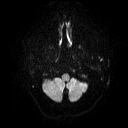
[im 19/55]
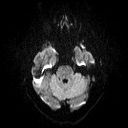
[im 28/55]
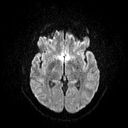
[im 37/55]
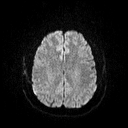
[im 46/55]
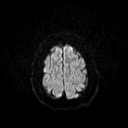
[im 55/55]
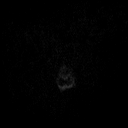

[Series 101: DWI · coronal · 3.0mm · 1.80mm/px · 6 of 49 slices shown (4 of 4)]
[im 1/49]
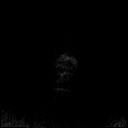
[im 10/49]
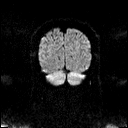
[im 20/49]
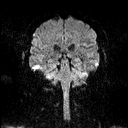
[im 29/49]
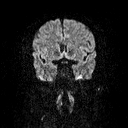
[im 39/49]
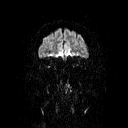
[im 49/49]
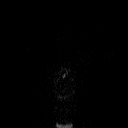

[48 of 48 positions shown; findings below may reference images not displayed]

FINDINGS: No evidence for acute infarction, hemorrhage, mass lesion,
hydrocephalus, or extra-axial fluid. Normal cerebral volume.

Mild to moderate subcortical and periventricular T2 and FLAIR
hyperintensities, nonspecific. Considerations include complicated
migraine, chronic microvascular ischemic change, vasculitis, chronic
infection, or idiopathic. Demyelinating disease is not favored.

Flow voids are maintained throughout the carotid, basilar, and
vertebral arteries. There are no areas of chronic hemorrhage.
Pituitary, pineal, and cerebellar tonsils unremarkable. No upper
cervical lesions.
IMPRESSION: Mild mild to moderate T2 and FLAIR hyperintensities throughout the
subcortical and periventricular white matter are nonspecific. See
discussion above.

No acute intracranial findings are evident.

## 2016-08-21 IMAGING — CT CT HEAD W/O CM
2 series · 16 of 30 positions shown, 20 images · non-contrast
Comparison: 04/27/2014 head CT.

CLINICAL DATA: RIGHT arm weakness and numbness. Lip numbness. Code
stroke.

EXAM:
CT HEAD WITHOUT CONTRAST
TECHNIQUE: Contiguous axial images were obtained from the base of the skull
through the vertex without intravenous contrast.

[Series 2: head wo · axial · 0.39mm/px · z∈[+646,+763]mm · 13 of 32 slices shown, 17 images]
[im 3/32  brain]
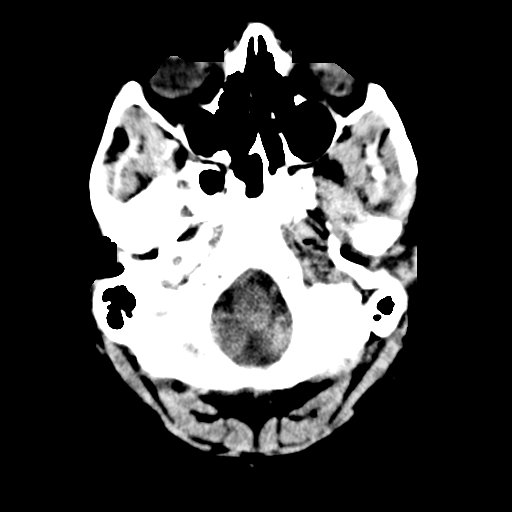
[im 3/32  bone]
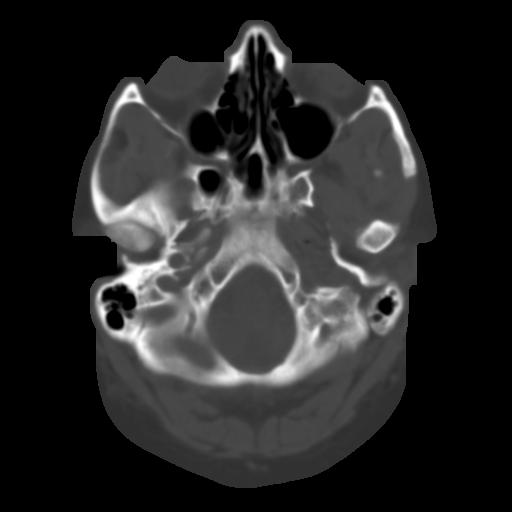
[im 5/32  brain]
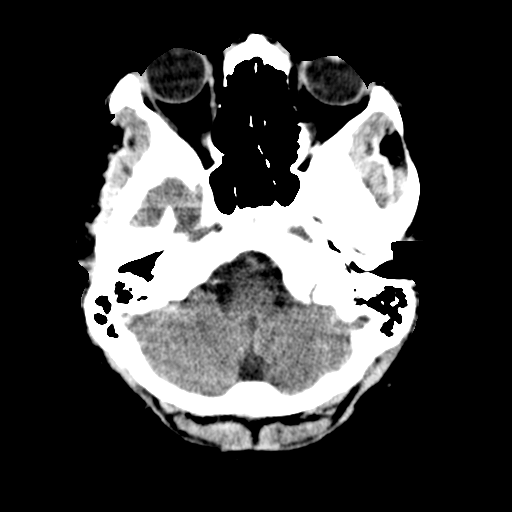
[im 7/32  brain]
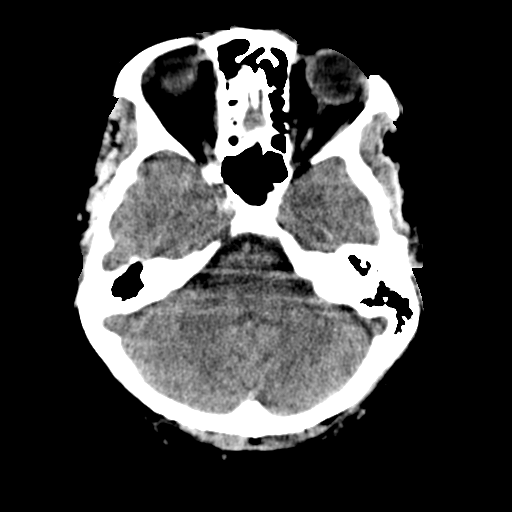
[im 9/32  brain]
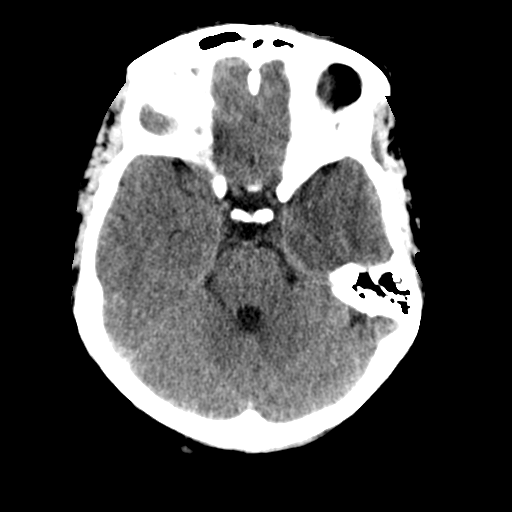
[im 12/32  brain]
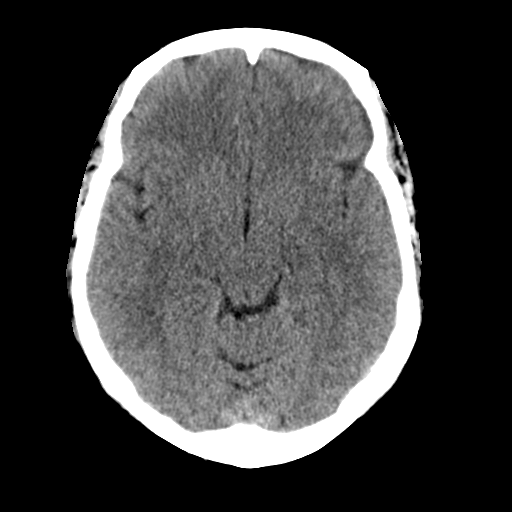
[im 12/32  bone]
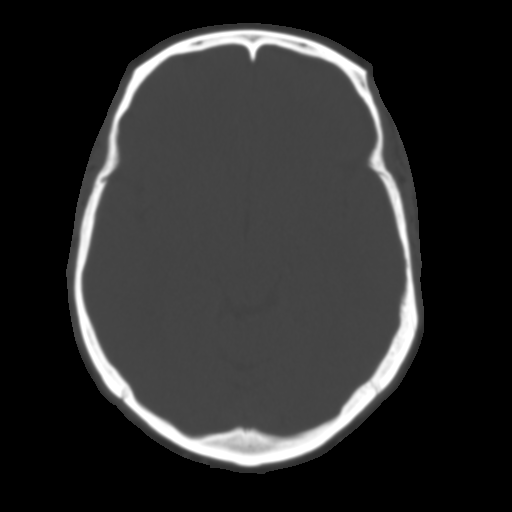
[im 14/32  brain]
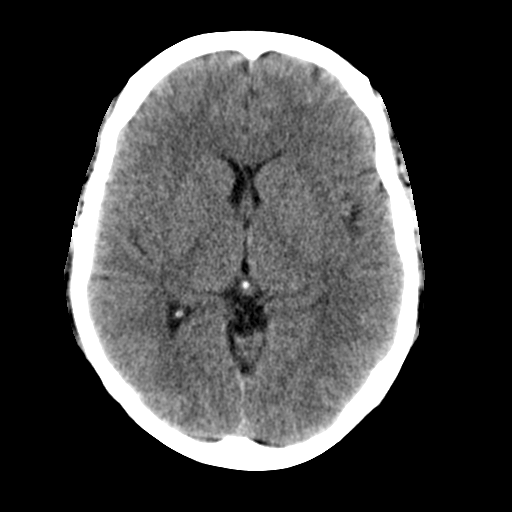
[im 16/32  brain]
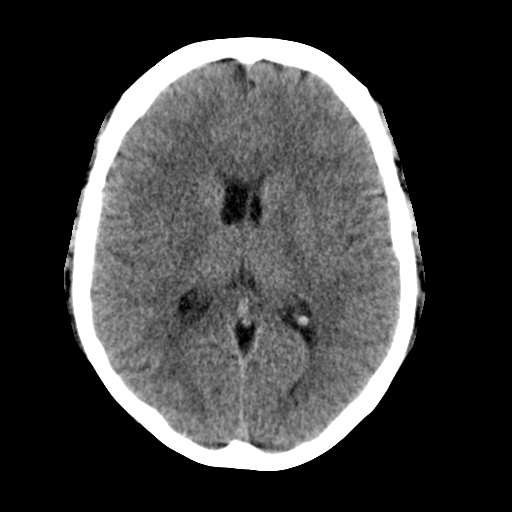
[im 18/32  brain]
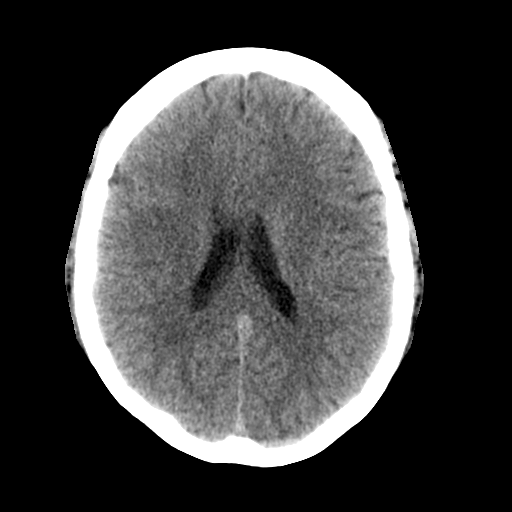
[im 20/32  brain]
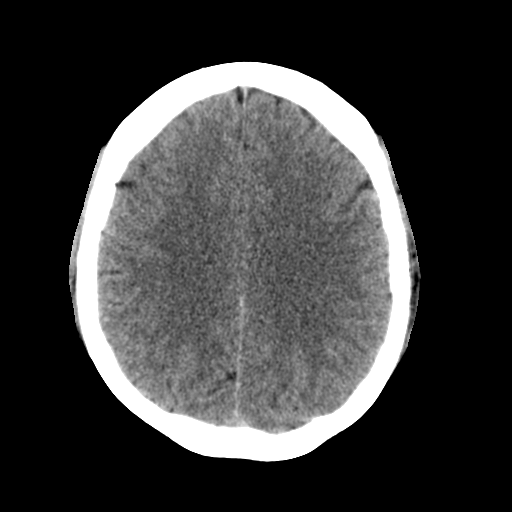
[im 20/32  bone]
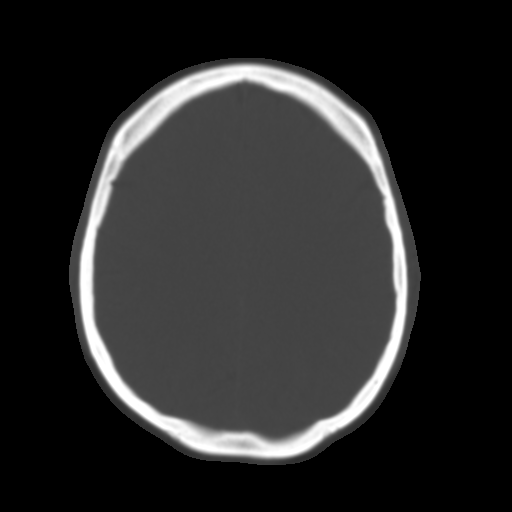
[im 23/32  brain]
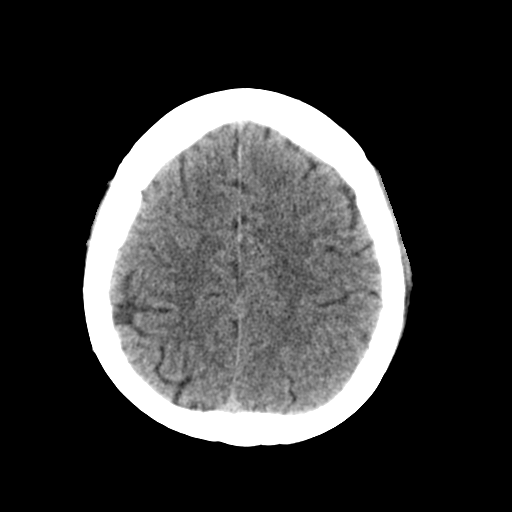
[im 25/32  brain]
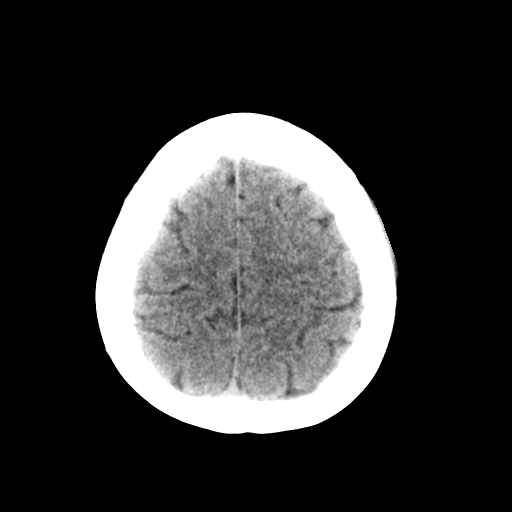
[im 27/32  brain]
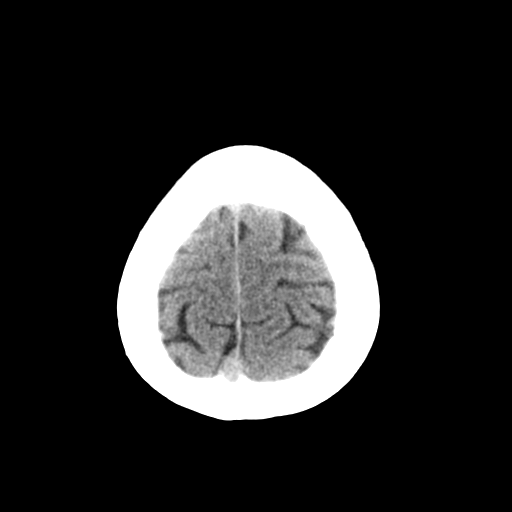
[im 29/32  brain]
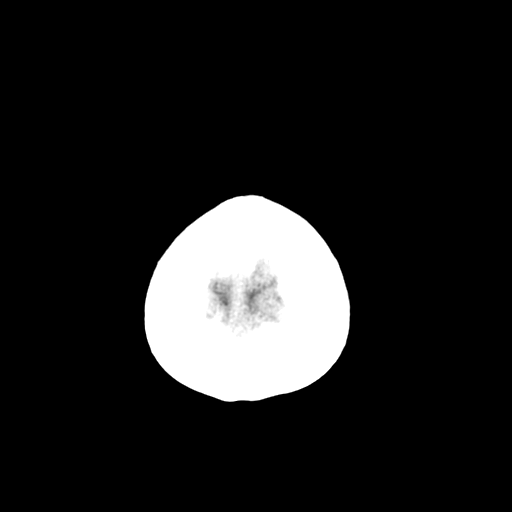
[im 29/32  bone]
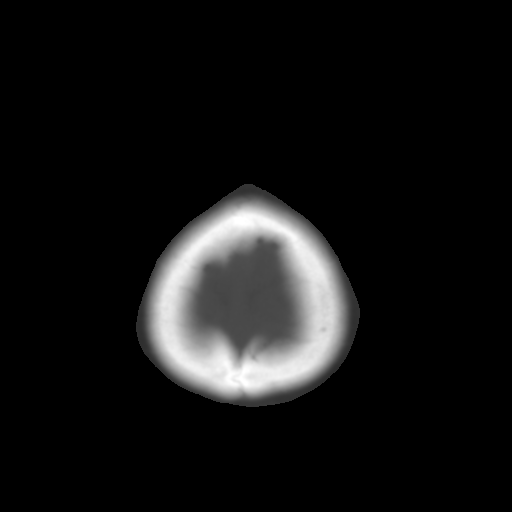

[Series 3: head bone · axial · 0.39mm/px · z∈[+643,+683]mm · 3 of 32 slices shown]
[im 3/32  bone]
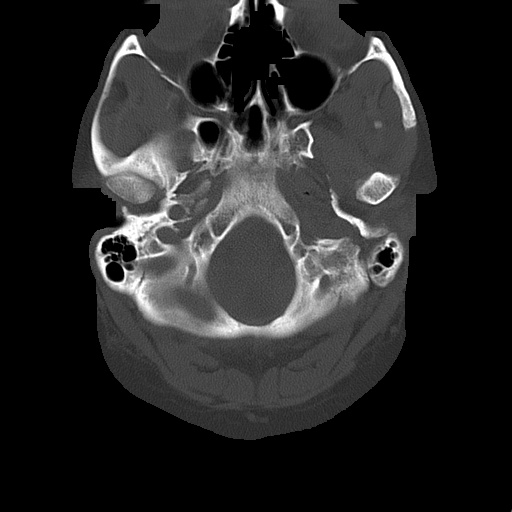
[im 7/32  bone]
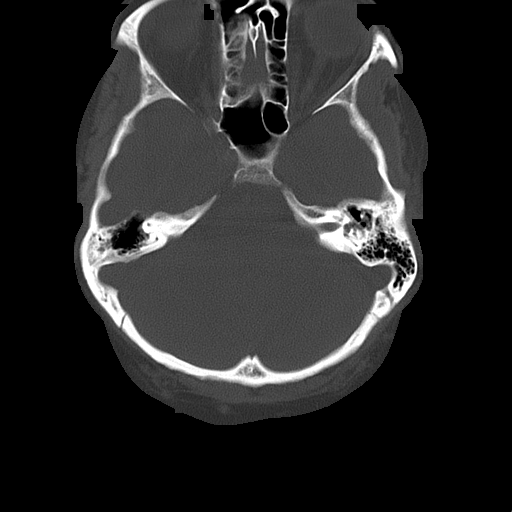
[im 12/32  bone]
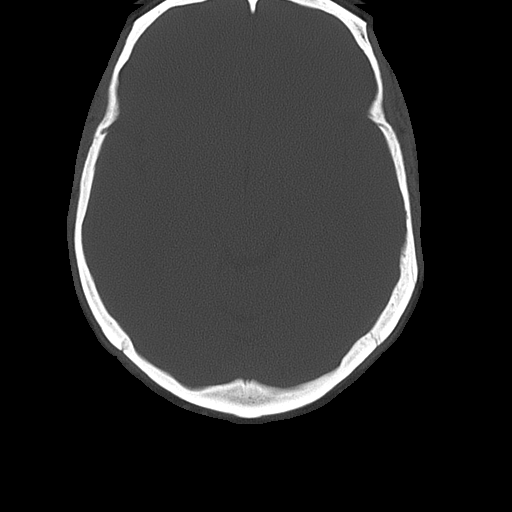

[16 of 30 positions shown; findings below may reference images not displayed]

FINDINGS: No mass lesion, mass effect, midline shift, hydrocephalus,
hemorrhage. No territorial ischemia or acute infarction. Mild motion
artifact is present through the middle and posterior cranial fossa.
Visible paranasal sinuses are clear.
IMPRESSION: Negative CT head. Critical Value/emergent results were called by
Dr. JD TIGER , who verbally acknowledged these results.

## 2016-08-22 IMAGING — CT CT HEAD W/O CM
1 series · 16 of 28 positions shown, 20 images · non-contrast
Comparison: MRI 06/05/2015

CLINICAL DATA: Right hand numbness and arm weakness.  Headache.

EXAM:
CT HEAD WITHOUT CONTRAST
TECHNIQUE: Contiguous axial images were obtained from the base of the skull
through the vertex without intravenous contrast.

[Series 2: soft tissue · axial · 0.39mm/px · z∈[+1144,+1269]mm · 16 of 28 slices shown, 20 images]
[im 2/28  brain]
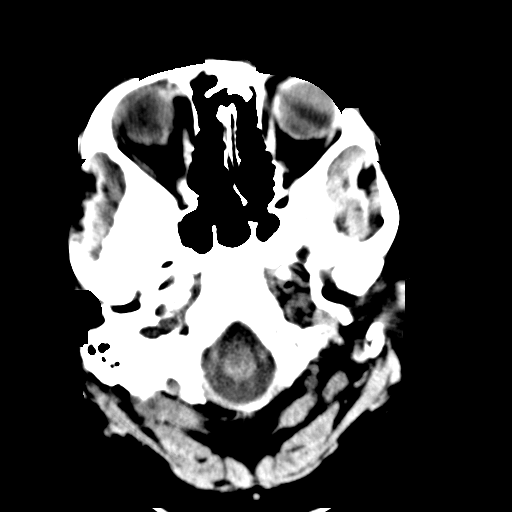
[im 2/28  bone]
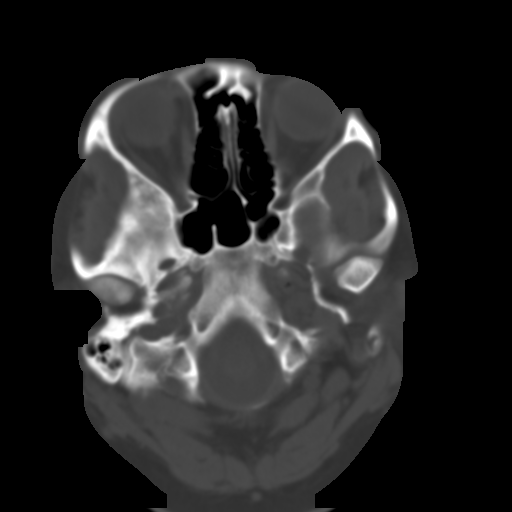
[im 4/28  brain]
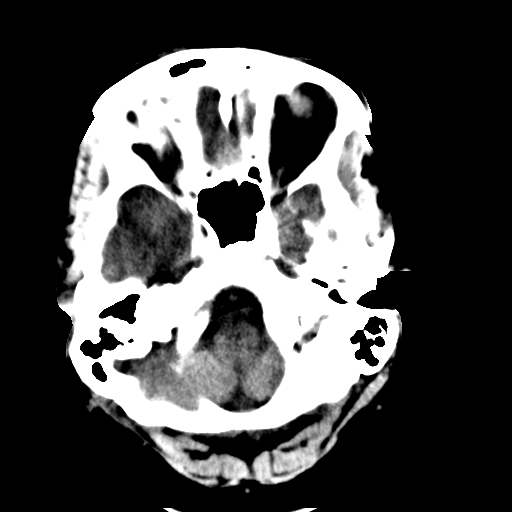
[im 6/28  brain]
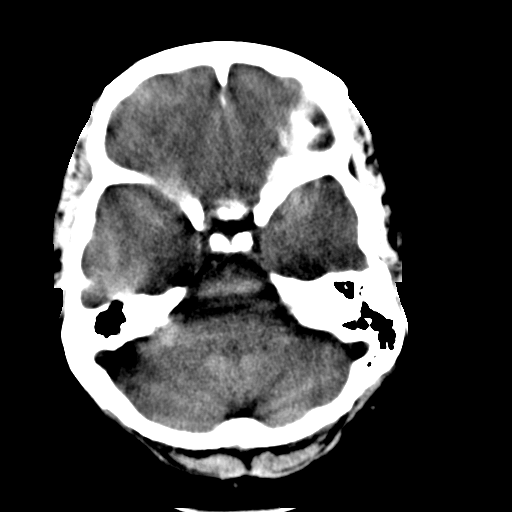
[im 7/28  brain]
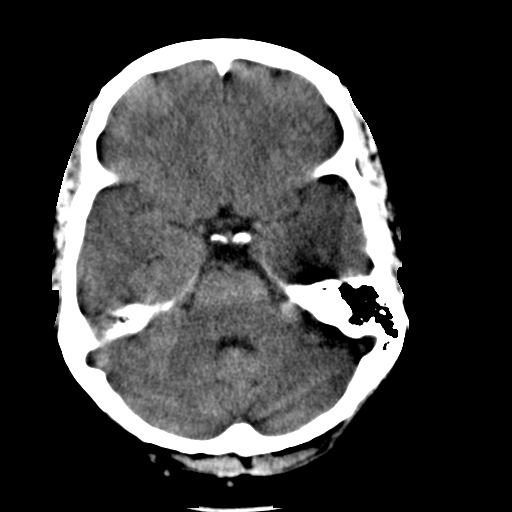
[im 9/28  brain]
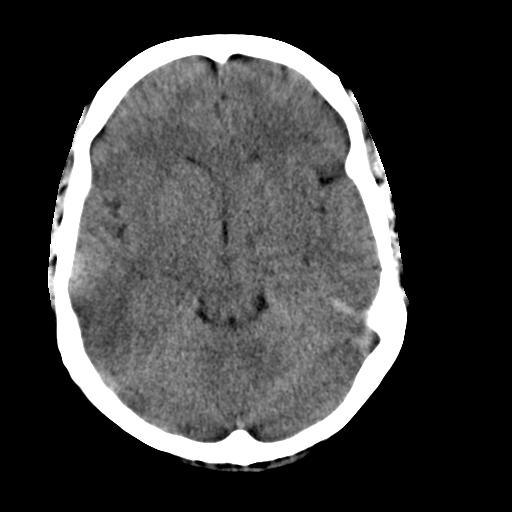
[im 9/28  bone]
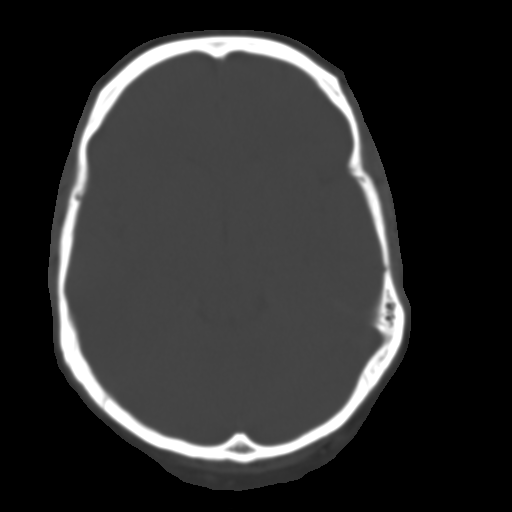
[im 10/28  brain]
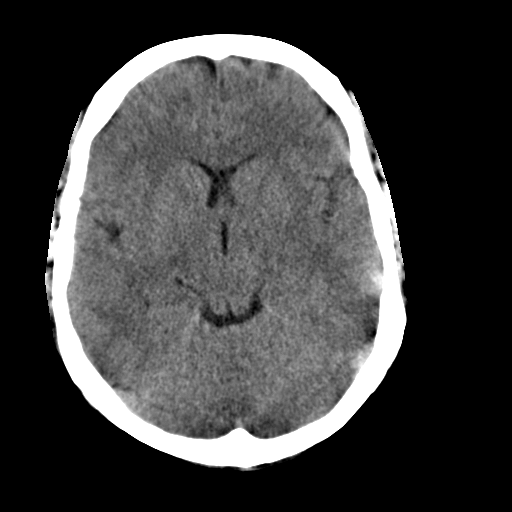
[im 12/28  brain]
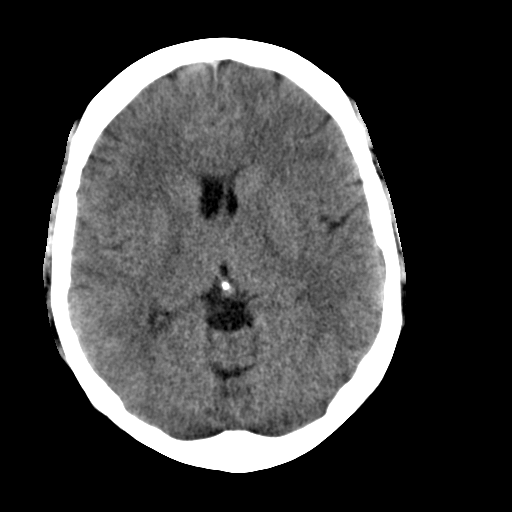
[im 14/28  brain]
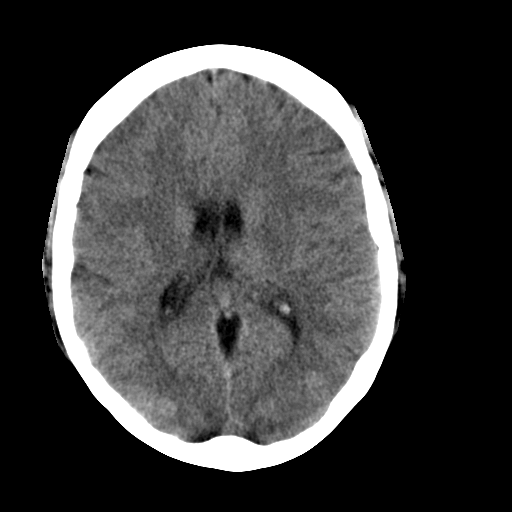
[im 15/28  brain]
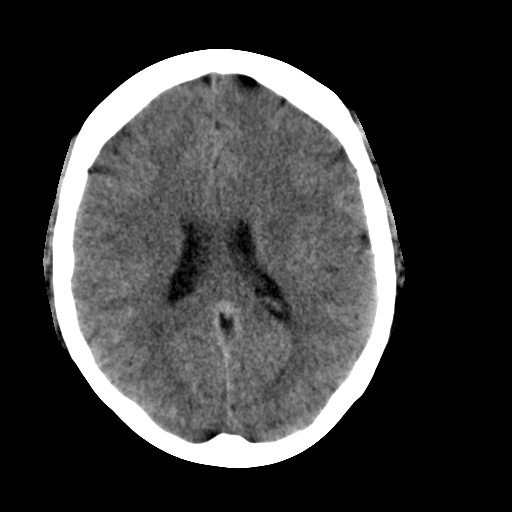
[im 15/28  bone]
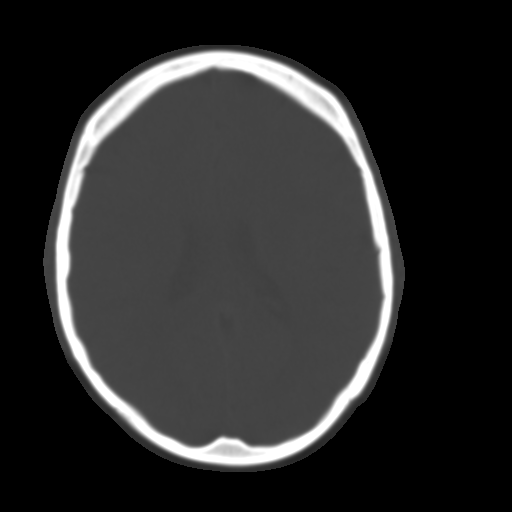
[im 17/28  brain]
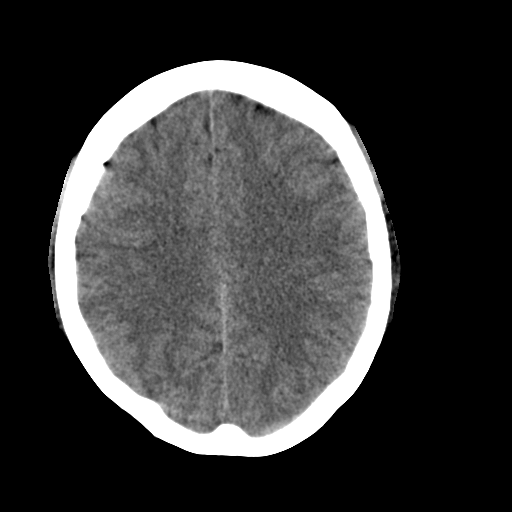
[im 19/28  brain]
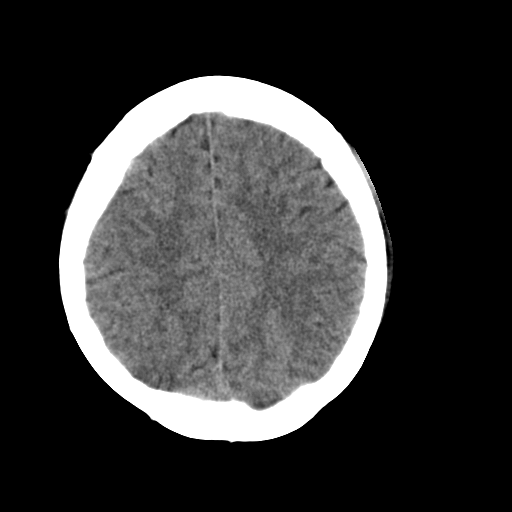
[im 20/28  brain]
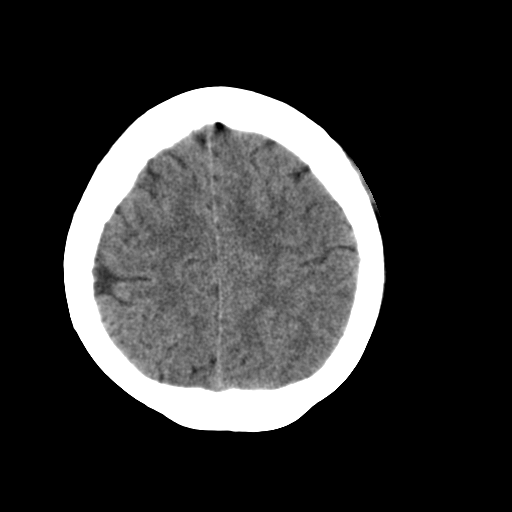
[im 22/28  brain]
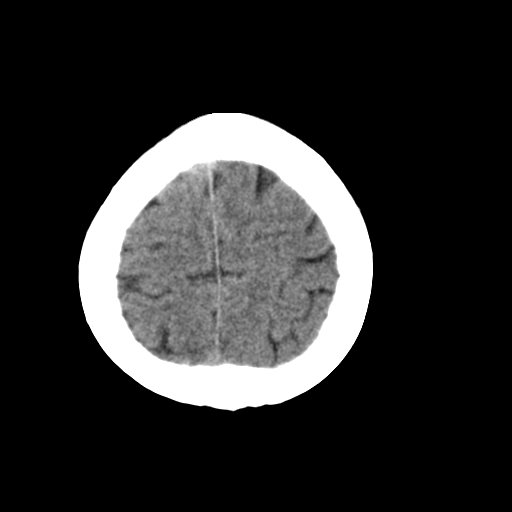
[im 22/28  bone]
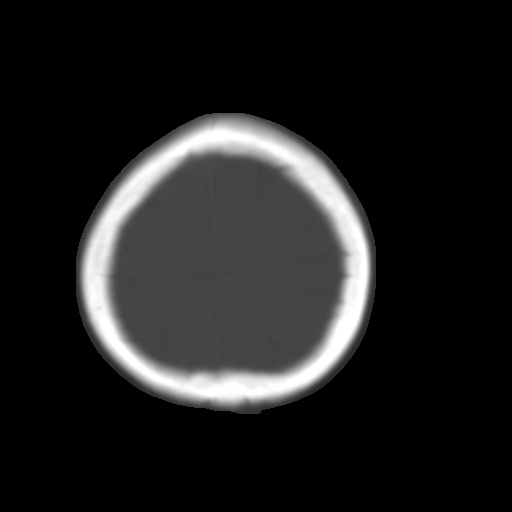
[im 23/28  brain]
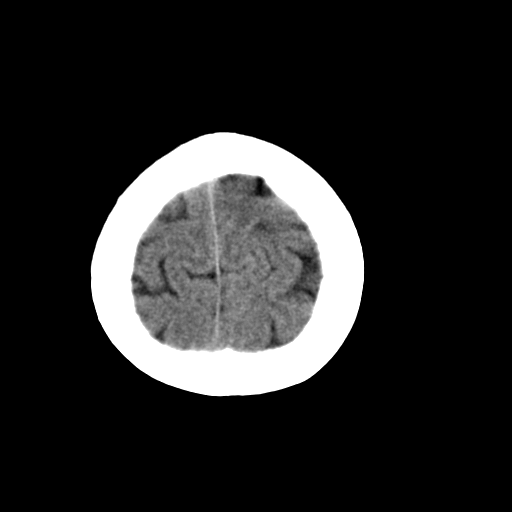
[im 25/28  brain]
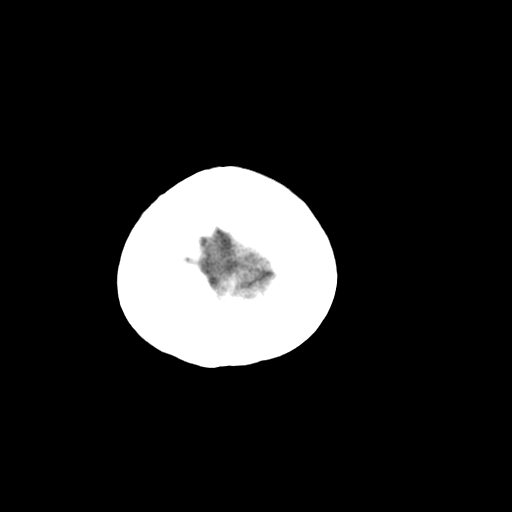
[im 27/28  brain]
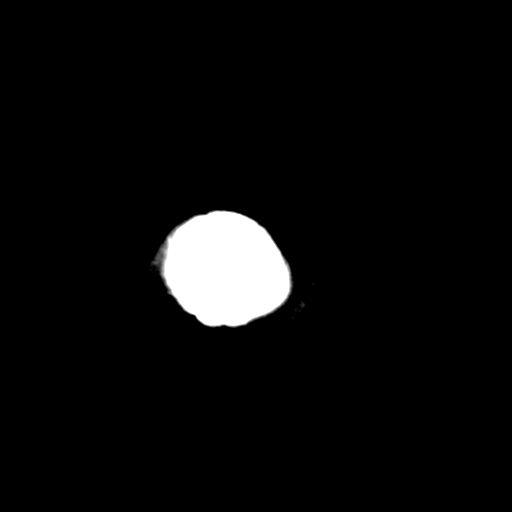

[16 of 28 positions shown; findings below may reference images not displayed]

FINDINGS: No acute intracranial abnormality. Specifically, no hemorrhage,
hydrocephalus, mass lesion, acute infarction, or significant
intracranial injury. No acute calvarial abnormality. Visualized
paranasal sinuses and mastoids clear. Orbital soft tissues
unremarkable.
IMPRESSION: Negative.

## 2016-11-29 ENCOUNTER — Encounter: Payer: Self-pay | Admitting: Emergency Medicine

## 2016-11-29 ENCOUNTER — Emergency Department
Admission: EM | Admit: 2016-11-29 | Discharge: 2016-11-29 | Disposition: A | Discharge status: Home / Self Care | Payer: BLUE CROSS/BLUE SHIELD | Attending: Emergency Medicine | Admitting: Emergency Medicine

## 2016-11-29 ENCOUNTER — Emergency Department: Payer: BLUE CROSS/BLUE SHIELD

## 2016-11-29 DIAGNOSIS — Z7982 Long term (current) use of aspirin: Secondary | ICD-10-CM | POA: Diagnosis not present

## 2016-11-29 DIAGNOSIS — Y929 Unspecified place or not applicable: Secondary | ICD-10-CM | POA: Diagnosis not present

## 2016-11-29 DIAGNOSIS — Y999 Unspecified external cause status: Secondary | ICD-10-CM | POA: Diagnosis not present

## 2016-11-29 DIAGNOSIS — S63501A Unspecified sprain of right wrist, initial encounter: Secondary | ICD-10-CM | POA: Diagnosis not present

## 2016-11-29 DIAGNOSIS — Y939 Activity, unspecified: Secondary | ICD-10-CM | POA: Diagnosis not present

## 2016-11-29 DIAGNOSIS — S6991XA Unspecified injury of right wrist, hand and finger(s), initial encounter: Secondary | ICD-10-CM | POA: Diagnosis present

## 2016-11-29 DIAGNOSIS — W000XXA Fall on same level due to ice and snow, initial encounter: Secondary | ICD-10-CM | POA: Diagnosis not present

## 2016-11-29 MED ORDER — IBUPROFEN 600 MG PO TABS: 600.0000 mg | ORAL_TABLET | Freq: Three times a day (TID) | ORAL | 0 refills | Status: DC | PRN

## 2016-11-29 MED ORDER — TRAMADOL HCL 50 MG PO TABS: 50.0000 mg | ORAL_TABLET | Freq: Four times a day (QID) | ORAL | 0 refills | Status: DC | PRN

## 2016-11-29 NOTE — Discharge Instructions (Signed)
Ice and elevate as needed for pain and swelling. Wear splint for protection and support. Tramadol as he for severe pain. Ibuprofen 3 times a day with food for pain and inflammation. Follow-up with Dr. Ernest PineHooten if any continued problems with your  wrist.

## 2016-11-29 NOTE — ED Triage Notes (Signed)
Fell on Wednesday, pain to right wrist.

## 2016-11-29 NOTE — ED Provider Notes (Signed)
[facility] Emergency Department Provider Note  ____________________________________________   First MD Initiated Contact with Patient 11/29/16 1645     (approximate)  I have reviewed the triage vital signs and the nursing notes.   HISTORY  Chief Complaint Wrist Pain   HPI Sophia Reeves is a 46 y.o. female is here complaining of right wrist pain.Patient states she fell Wednesday due to ice. She denies any head injury or loss consciousness during this event. She has continued to experience pain in her wrist which is gotten worse in the last 24 hours. She has not taken any over-the-counter medication for her pain. Denies any previous injury to her wrist. Currently she rates her pain as an 8 out of 10.   Past Medical History:  Diagnosis Date  . Factor V deficiency (HCC)   . Migraine     Patient Active Problem List   Diagnosis Date Noted  . Cerebral thrombosis with cerebral infarction (HCC) 06/06/2015  . Numbness and tingling in right hand 06/05/2015    Past Surgical History:  Procedure Laterality Date  . TUBAL LIGATION      Prior to Admission medications   Medication Sig Start Date End Date Taking? Authorizing Provider  aspirin EC 325 MG tablet Take 1 tablet (325 mg total) by mouth daily. 06/09/15   Emily FilbertJonathan E Williams, MD  ibuprofen (ADVIL,MOTRIN) 600 MG tablet Take 1 tablet (600 mg total) by mouth every 8 (eight) hours as needed. 11/29/16   Tommi Rumpshonda L Verneice Caspers, PA-C  topiramate (TOPAMAX) 50 MG tablet Take 1 tablet (50 mg total) by mouth 2 (two) times daily. 06/09/15 06/08/16  Emily FilbertJonathan E Williams, MD  traMADol (ULTRAM) 50 MG tablet Take 1 tablet (50 mg total) by mouth every 6 (six) hours as needed. 11/29/16   Tommi Rumpshonda L Ilay Capshaw, PA-C    Allergies Patient has no known allergies.  Family History  Problem Relation Age of Onset  . CAD Father   . Diabetes Mother     Social History Social History  Substance Use Topics  . Smoking status: Never Smoker    . Smokeless tobacco: Never Used  . Alcohol use No    Review of Systems Constitutional: No fever/chills ENT: No trauma Cardiovascular: Denies chest pain. Respiratory: Denies shortness of breath. Gastrointestinal:  No nausea, no vomiting.   Musculoskeletal: Positive for right wrist pain. Skin: Negative for rash. Neurological: Negative for headaches, focal weakness or numbness.  10-point ROS otherwise negative.  ____________________________________________   PHYSICAL EXAM:  VITAL SIGNS: ED Triage Vitals  Enc Vitals Group     BP 11/29/16 0927 128/86     Pulse Rate 11/29/16 0927 90     Resp 11/29/16 0927 16     Temp 11/29/16 0927 97.9 F (36.6 C)     Temp Source 11/29/16 0927 Oral     SpO2 11/29/16 0927 98 %     Weight 11/29/16 0921 210 lb (95.3 kg)     Height 11/29/16 0921 5\' 3"  (1.6 m)     Head Circumference --      Peak Flow --      Pain Score 11/29/16 0921 8     Pain Loc --      Pain Edu? --      Excl. in GC? --     Constitutional: Alert and oriented. Well appearing and in no acute distress. Eyes: Conjunctivae are normal. PERRL. EOMI. Head: Atraumatic. Nose: No congestion/rhinnorhea. Neck: No stridor.  Nontender to palpation posterior cervical spine spine. Cardiovascular:  Normal rate, regular rhythm. Grossly normal heart sounds.  Good peripheral circulation. Respiratory: Normal respiratory effort.  No retractions. Lungs CTAB. Musculoskeletal: Examination of the right wrist there is no gross deformity and no soft tissue swelling present. There is moderate tenderness on palpation of the distal radius on the left. Range of motion is restricted secondary to discomfort. Patient is able to make a fist and motor sensory function is intact distal to her injury. Capillary refill is less than 3 seconds. Skin is intact without ecchymosis or abrasions. Elbow is nontender and range of motion is without restriction. Neurologic:  Normal speech and language. No gross focal  neurologic deficits are appreciated. No gait instability. Skin:  Skin is warm, dry and intact. No rash noted. Psychiatric: Mood and affect are normal. Speech and behavior are normal.  ____________________________________________   LABS (all labs ordered are listed, but only abnormal results are displayed)  Labs Reviewed - No data to display  RADIOLOGY Right wrist per radiologist as negative for fracture or dislocation. I, Tommi Rumps, personally viewed and evaluated these images (plain radiographs) as part of my medical decision making, as well as reviewing the written report by the radiologist.  ____________________________________________   PROCEDURES  Procedure(s) performed: None  Procedures  Critical Care performed: No  ____________________________________________   INITIAL IMPRESSION / ASSESSMENT AND PLAN / ED COURSE  Pertinent labs & imaging results that were available during my care of the patient were reviewed by me and considered in my medical decision making (see chart for details).  She was placed in a Velcro cockup wrist splint. She was told that she does not have fracture of her right wrist. She was given a prescription for ibuprofen 600 mg 3 times a day with food and tramadol as needed for severe pain. She is to follow-up with Dr. Ernest Pine if any continued problems with her wrist. It started to ice and elevate her wrist as needed for pain or swelling.      ____________________________________________   FINAL CLINICAL IMPRESSION(S) / ED DIAGNOSES  Final diagnoses:  Sprain of right wrist, initial encounter      NEW MEDICATIONS STARTED DURING THIS VISIT:  Discharge Medication List as of 11/29/2016  9:58 AM    START taking these medications   Details  ibuprofen (ADVIL,MOTRIN) 600 MG tablet Take 1 tablet (600 mg total) by mouth every 8 (eight) hours as needed., Starting Sat 11/29/2016, Print    traMADol (ULTRAM) 50 MG tablet Take 1 tablet (50 mg  total) by mouth every 6 (six) hours as needed., Starting Sat 11/29/2016, Print         Note:  This document was prepared using Dragon voice recognition software and may include unintentional dictation errors.    Tommi Rumps, PA-C 11/29/16 1651    Minna Antis, MD 11/30/16 2228

## 2018-10-20 ENCOUNTER — Encounter: Payer: Self-pay | Admitting: Emergency Medicine

## 2018-10-20 ENCOUNTER — Emergency Department: Payer: Self-pay

## 2018-10-20 ENCOUNTER — Emergency Department
Admission: EM | Admit: 2018-10-20 | Discharge: 2018-10-20 | Disposition: A | Discharge status: Home / Self Care | Payer: Self-pay | Attending: Emergency Medicine | Admitting: Emergency Medicine

## 2018-10-20 DIAGNOSIS — M4185 Other forms of scoliosis, thoracolumbar region: Secondary | ICD-10-CM | POA: Insufficient documentation

## 2018-10-20 DIAGNOSIS — G8929 Other chronic pain: Secondary | ICD-10-CM | POA: Insufficient documentation

## 2018-10-20 DIAGNOSIS — M5442 Lumbago with sciatica, left side: Secondary | ICD-10-CM | POA: Insufficient documentation

## 2018-10-20 DIAGNOSIS — Z7982 Long term (current) use of aspirin: Secondary | ICD-10-CM | POA: Insufficient documentation

## 2018-10-20 MED ORDER — CYCLOBENZAPRINE HCL 10 MG PO TABS: 10.0000 mg | ORAL_TABLET | Freq: Three times a day (TID) | ORAL | 0 refills | Status: DC | PRN

## 2018-10-20 MED ORDER — OXYCODONE-ACETAMINOPHEN 7.5-325 MG PO TABS: 1.0000 | ORAL_TABLET | Freq: Four times a day (QID) | ORAL | 0 refills | Status: DC | PRN

## 2018-10-20 MED ORDER — METHYLPREDNISOLONE SODIUM SUCC 125 MG IJ SOLR
125.0000 mg | Freq: Once | INTRAMUSCULAR | Status: AC
  Administered 2018-10-20: 125 mg via INTRAMUSCULAR
  Filled 2018-10-20: qty 2

## 2018-10-20 MED ORDER — HYDROMORPHONE HCL 1 MG/ML IJ SOLN
1.0000 mg | Freq: Once | INTRAMUSCULAR | Status: AC
  Administered 2018-10-20: 1 mg via INTRAMUSCULAR
  Filled 2018-10-20: qty 1

## 2018-10-20 MED ORDER — ORPHENADRINE CITRATE 30 MG/ML IJ SOLN
60.0000 mg | Freq: Two times a day (BID) | INTRAMUSCULAR | Status: DC
  Administered 2018-10-20: 60 mg via INTRAMUSCULAR
  Filled 2018-10-20: qty 2

## 2018-10-20 MED ORDER — METHYLPREDNISOLONE 4 MG PO TBPK: ORAL_TABLET | ORAL | 0 refills | Status: DC

## 2018-10-20 NOTE — ED Provider Notes (Signed)
[facility] Emergency Department Provider Note   ____________________________________________   First MD Initiated Contact with Patient 10/20/18 1314     (approximate)  I have reviewed the triage vital signs and the nursing notes.   HISTORY  Chief Complaint Back Pain    HPI Sophia Reeves is a 47 y.o. female patient complain of back pain for 1 month which worsened in the last 5 days.  Patient describes radicular component to her pain to the left lower extremity.  Patient denies bladder or bowel dysfunction.  Patient rates pain as a 10/10.  Patient described the pain is "sharp".  No palliative measure for complaint.  Past Medical History:  Diagnosis Date  . Factor V deficiency (HCC)   . Migraine     Patient Active Problem List   Diagnosis Date Noted  . Cerebral thrombosis with cerebral infarction (HCC) 06/06/2015  . Numbness and tingling in right hand 06/05/2015    Past Surgical History:  Procedure Laterality Date  . TUBAL LIGATION      Prior to Admission medications   Medication Sig Start Date End Date Taking? Authorizing Provider  aspirin EC 325 MG tablet Take 1 tablet (325 mg total) by mouth daily. 06/09/15   Emily Filbert, MD  cyclobenzaprine (FLEXERIL) 10 MG tablet Take 1 tablet (10 mg total) by mouth 3 (three) times daily as needed. 10/20/18   Joni Reining, PA-C  ibuprofen (ADVIL,MOTRIN) 600 MG tablet Take 1 tablet (600 mg total) by mouth every 8 (eight) hours as needed. 11/29/16   Tommi Rumps, PA-C  methylPREDNISolone (MEDROL DOSEPAK) 4 MG TBPK tablet Take Tapered dose as directed 10/20/18   Joni Reining, PA-C  oxyCODONE-acetaminophen (PERCOCET) 7.5-325 MG tablet Take 1 tablet by mouth every 6 (six) hours as needed. 10/20/18   Joni Reining, PA-C  topiramate (TOPAMAX) 50 MG tablet Take 1 tablet (50 mg total) by mouth 2 (two) times daily. 06/09/15 06/08/16  Emily Filbert, MD  traMADol (ULTRAM) 50 MG tablet Take 1  tablet (50 mg total) by mouth every 6 (six) hours as needed. 11/29/16   Tommi Rumps, PA-C    Allergies Patient has no known allergies.  Family History  Problem Relation Age of Onset  . CAD Father   . Diabetes Mother     Social History Social History   Tobacco Use  . Smoking status: Never Smoker  . Smokeless tobacco: Never Used  Substance Use Topics  . Alcohol use: No    Alcohol/week: 0.0 standard drinks  . Drug use: No    Review of Systems Constitutional: No fever/chills Eyes: No visual changes. ENT: No sore throat. Cardiovascular: Denies chest pain. Respiratory: Denies shortness of breath. Gastrointestinal: No abdominal pain.  No nausea, no vomiting.  No diarrhea.  No constipation. Genitourinary: Negative for dysuria. Musculoskeletal: Negative for back pain. Skin: Negative for rash. Neurological: Negative for headaches, focal weakness or numbness. Hematological/Lymphatic:Factor V deficiency.   ____________________________________________   PHYSICAL EXAM:  VITAL SIGNS: ED Triage Vitals  Enc Vitals Group     BP --      Pulse Rate 10/20/18 1209 93     Resp 10/20/18 1209 20     Temp 10/20/18 1209 98.1 F (36.7 C)     Temp Source 10/20/18 1209 Oral     SpO2 10/20/18 1209 97 %     Weight 10/20/18 1204 210 lb (95.3 kg)     Height 10/20/18 1204 5\' 4"  (1.626 m)  Head Circumference --      Peak Flow --      Pain Score 10/20/18 1204 10     Pain Loc --      Pain Edu? --      Excl. in GC? --     Constitutional: Alert and oriented. Well appearing and in no acute distress. Cardiovascular: Normal rate, regular rhythm. Grossly normal heart sounds.  Good peripheral circulation. Respiratory: Normal respiratory effort.  No retractions. Lungs CTAB. Gastrointestinal: Soft and nontender. No distention. No abdominal bruits. No CVA tenderness. Musculoskeletal: Patient sits and stands with reliance on the upper extremities.  No obvious spinal deformity.  Decreased  range of motion is all fields. Neurologic:  Normal speech and language. No gross focal neurologic deficits are appreciated. No gait instability. Skin:  Skin is warm, dry and intact. No rash noted. Psychiatric: Mood and affect are normal. Speech and behavior are normal.  ____________________________________________   LABS (all labs ordered are listed, but only abnormal results are displayed)  Labs Reviewed - No data to display ____________________________________________  EKG   ____________________________________________  RADIOLOGY  ED MD interpretation:   Official radiology report(s): Dg Lumbar Spine Complete  Result Date: 10/20/2018 CLINICAL DATA:  Lumbago with left-sided radicular symptoms EXAM: LUMBAR SPINE - COMPLETE 4+ VIEW COMPARISON:  None. FINDINGS: Frontal, lateral, spot lumbosacral lateral, and bilateral oblique views were obtained. There are 5 non-rib-bearing lumbar type vertebral bodies. T12 ribs are hypoplastic. There is lumbar levoscoliosis with slight rotatory component. There is no fracture or spondylolisthesis. Disc spaces appear unremarkable. There is slight facet osteoarthritic change at L4-5 and L5-S1 bilaterally. IMPRESSION: Scoliosis with slight lower lumbar osteoarthritic change. No fracture or spondylolisthesis. Electronically Signed   By: Bretta BangWilliam  Woodruff III M.D.   On: 10/20/2018 14:46    ____________________________________________   PROCEDURES  Procedure(s) performed: None  Procedures  Critical Care performed: No  ____________________________________________   INITIAL IMPRESSION / ASSESSMENT AND PLAN / ED COURSE  As part of my medical decision making, I reviewed the following data within the electronic MEDICAL RECORD NUMBER    Patient presents with worsening of her chronic back pain in the past 5 days.  Patient states there is a radicular component to her pain to the left lower extremity.  Patient denies any provocative incident for increased  pain.  Discussed x-ray findings with patient which reveals degenerative changes along with levoscoliosis.  Patient given discharge care instruction work note.  Patient advised take medication as directed follow-up with PCP.     ____________________________________________   FINAL CLINICAL IMPRESSION(S) / ED DIAGNOSES  Final diagnoses:  Chronic bilateral low back pain with left-sided sciatica  Other forms of scoliosis, thoracolumbar region     ED Discharge Orders         Ordered    oxyCODONE-acetaminophen (PERCOCET) 7.5-325 MG tablet  Every 6 hours PRN     10/20/18 1511    cyclobenzaprine (FLEXERIL) 10 MG tablet  3 times daily PRN     10/20/18 1511    methylPREDNISolone (MEDROL DOSEPAK) 4 MG TBPK tablet     10/20/18 1511           Note:  This document was prepared using Dragon voice recognition software and may include unintentional dictation errors.    Joni ReiningSmith, Julyan Gales K, PA-C 10/20/18 1514    Charlynne PanderYao, David Hsienta, MD 10/22/18 [phone]

## 2018-10-20 NOTE — ED Triage Notes (Signed)
Pt reports back pain for awhile but hurt it worse Friday and now it radiates up.

## 2018-12-02 ENCOUNTER — Other Ambulatory Visit: Payer: Self-pay | Admitting: Family Medicine

## 2018-12-02 DIAGNOSIS — Z1231 Encounter for screening mammogram for malignant neoplasm of breast: Secondary | ICD-10-CM

## 2018-12-13 ENCOUNTER — Ambulatory Visit
Admission: RE | Admit: 2018-12-13 | Discharge: 2018-12-13 | Disposition: A | Discharge status: Home / Self Care | Payer: BLUE CROSS/BLUE SHIELD | Source: Ambulatory Visit | Attending: Family Medicine | Admitting: Family Medicine

## 2018-12-13 DIAGNOSIS — Z1231 Encounter for screening mammogram for malignant neoplasm of breast: Secondary | ICD-10-CM | POA: Diagnosis present

## 2020-01-23 ENCOUNTER — Encounter: Payer: Self-pay | Admitting: Emergency Medicine

## 2020-01-23 ENCOUNTER — Emergency Department
Admission: EM | Admit: 2020-01-23 | Discharge: 2020-01-23 | Disposition: A | Discharge status: Home / Self Care | Payer: BC Managed Care – PPO | Attending: Emergency Medicine | Admitting: Emergency Medicine

## 2020-01-23 ENCOUNTER — Other Ambulatory Visit: Payer: Self-pay

## 2020-01-23 DIAGNOSIS — R222 Localized swelling, mass and lump, trunk: Secondary | ICD-10-CM | POA: Diagnosis present

## 2020-01-23 DIAGNOSIS — Z7982 Long term (current) use of aspirin: Secondary | ICD-10-CM | POA: Diagnosis not present

## 2020-01-23 DIAGNOSIS — L0291 Cutaneous abscess, unspecified: Secondary | ICD-10-CM

## 2020-01-23 DIAGNOSIS — L02213 Cutaneous abscess of chest wall: Secondary | ICD-10-CM | POA: Insufficient documentation

## 2020-01-23 MED ORDER — SULFAMETHOXAZOLE-TRIMETHOPRIM 800-160 MG PO TABS: 1.0000 | ORAL_TABLET | Freq: Two times a day (BID) | ORAL | 0 refills | Status: AC

## 2020-01-23 MED ORDER — LIDOCAINE-EPINEPHRINE 1 %-1:100000 IJ SOLN
10.0000 mL | Freq: Once | INTRAMUSCULAR | Status: AC
Start: 2020-01-23 — End: 2020-01-23
  Administered 2020-01-23: 10 mL
  Filled 2020-01-23: qty 10

## 2020-01-23 MED ORDER — HYDROCODONE-ACETAMINOPHEN 5-325 MG PO TABS: 1.0000 | ORAL_TABLET | ORAL | 0 refills | Status: AC | PRN

## 2020-01-23 MED ORDER — SULFAMETHOXAZOLE-TRIMETHOPRIM 800-160 MG PO TABS
1.0000 | ORAL_TABLET | Freq: Once | ORAL | Status: AC
  Administered 2020-01-23: 1 via ORAL
  Filled 2020-01-23: qty 1

## 2020-01-23 NOTE — ED Notes (Signed)
See triage note  Presents with possible abscess area under right arm  Thinks she may have gotten a spider bite to area

## 2020-01-23 NOTE — ED Provider Notes (Signed)
[facility] Emergency Department Provider Note  ____________________________________________  Time seen: Approximately 3:51 PM  I have reviewed the triage vital signs and the nursing notes.   HISTORY  Chief Complaint Abscess    HPI Sophia Reeves is a 49 y.o. female who presents the emergency department concern for an abscess to the right mid axillary line below the level of the axilla.  This is along the chest wall.  She states that she noticed it several days ago, it has been worsening and has a "whitehead."  Patient states that it was marked yesterday by a friend with indelible marker.  She has not had increased erythema but given the duration, the increasing pain she presents to be evaluated.  No systemic complaints of fever, URI symptoms, chest pain, shortness of breath.  No history of recurring skin lesions.  Patient does have a history of factor V deficiency.  She states that she has no issues with bleeding or clotting at this time.  She states that on her original diagnosis she was having issues with clotting, eventually the work-up revealed that she had factor V.  Patient denies any other complaints at this time.  No medications for this prior to arrival.  No recent antibiotic use.         Past Medical History:  Diagnosis Date  . Factor V deficiency ([place])   . Migraine     Patient Active Problem List   Diagnosis Date Noted  . Cerebral thrombosis with cerebral infarction ([place]) 06/06/2015  . Numbness and tingling in right hand 06/05/2015    Past Surgical History:  Procedure Laterality Date  . TUBAL LIGATION      Prior to Admission medications   Medication Sig Start Date End Date Taking? Authorizing Provider  aspirin EC 325 MG tablet Take 1 tablet (325 mg total) by mouth daily. 06/09/15   Earleen Newport, MD  HYDROcodone-acetaminophen (NORCO/VICODIN) 5-325 MG tablet Take 1 tablet by mouth every 4 (four) hours as needed for moderate pain.  01/23/20   Mabeline Varas, Charline Bills, PA-C  sulfamethoxazole-trimethoprim (BACTRIM DS) 800-160 MG tablet Take 1 tablet by mouth 2 (two) times daily. 01/23/20   Paddy Neis, Charline Bills, PA-C  topiramate (TOPAMAX) 50 MG tablet Take 1 tablet (50 mg total) by mouth 2 (two) times daily. 06/09/15 06/08/16  Earleen Newport, MD    Allergies Patient has no known allergies.  Family History  Problem Relation Age of Onset  . CAD Father   . Diabetes Mother     Social History Social History   Tobacco Use  . Smoking status: Never Smoker  . Smokeless tobacco: Never Used  Substance Use Topics  . Alcohol use: No    Alcohol/week: 0.0 standard drinks  . Drug use: No     Review of Systems  Constitutional: No fever/chills Eyes: No visual changes. No discharge ENT: No upper respiratory complaints. Cardiovascular: no chest pain. Respiratory: no cough. No SOB. Gastrointestinal: No abdominal pain.  No nausea, no vomiting.  No diarrhea.  No constipation. Musculoskeletal: Negative for musculoskeletal pain. Skin: Positive for erythematous and edematous lesion mid axillary line right side below the level of axilla. Neurological: Negative for headaches, focal weakness or numbness. 10-point ROS otherwise negative.  ____________________________________________   PHYSICAL EXAM:  VITAL SIGNS: ED Triage Vitals  Enc Vitals Group     BP 01/23/20 1537 115/82     Pulse Rate 01/23/20 1537 93     Resp 01/23/20 1537 18     Temp  01/23/20 1537 98.8 F (37.1 C)     Temp Source 01/23/20 1537 Oral     SpO2 01/23/20 1537 99 %     Weight 01/23/20 1535 210 lb (95.3 kg)     Height 01/23/20 1535 5\' 3"  (1.6 m)     Head Circumference --      Peak Flow --      Pain Score 01/23/20 1535 10     Pain Loc --      Pain Edu? --      Excl. in GC? --      Constitutional: Alert and oriented. Well appearing and in no acute distress. Eyes: Conjunctivae are normal. PERRL. EOMI. Head: Atraumatic. ENT:      Ears:        Nose: No congestion/rhinnorhea.      Mouth/Throat: Mucous membranes are moist.  Neck: No stridor.    Cardiovascular: Normal rate, regular rhythm. Normal S1 and S2.  Good peripheral circulation. Respiratory: Normal respiratory effort without tachypnea or retractions. Lungs CTAB. Good air entry to the bases with no decreased or absent breath sounds. Musculoskeletal: Full range of motion to all extremities. No gross deformities appreciated. Neurologic:  Normal speech and language. No gross focal neurologic deficits are appreciated.  Skin:  Skin is warm, dry and intact. No rash noted.  Visualization of the right chest wall reveals an erythematous and edematous lesion mid axillary line.  Patient has a centralized pustule with mild induration underlying the pustule.  No significant fluctuance.  Firmness extends approximately 4 cm in diameter around central pustule.  Gross erythema measures approximately 8 cm.  No axillary lymphadenopathy.  Bedside ultrasound reveals small loculated fluid collection consistent with abscess under pustule.  This measures approximately a centimeter in diameter and extends for roughly 2 cm. Psychiatric: Mood and affect are normal. Speech and behavior are normal. Patient exhibits appropriate insight and judgement.   ____________________________________________   LABS (all labs ordered are listed, but only abnormal results are displayed)  Labs Reviewed - No data to display ____________________________________________  EKG   ____________________________________________  RADIOLOGY   No results found.  ____________________________________________    PROCEDURES  Procedure(s) performed:    03/17/21Marland KitchenIncision and Drainage  Date/Time: 01/23/2020 4:06 PM Performed by: 01/25/2020, PA-C Authorized by: Racheal Patches, PA-C   Consent:    Consent obtained:  Verbal   Consent given by:  Patient   Risks discussed:  Bleeding, incomplete drainage and  pain Location:    Type:  Abscess   Location:  Trunk   Trunk location:  Chest (Right chest wall, midaxillary line) Pre-procedure details:    Skin preparation:  Betadine Anesthesia (see MAR for exact dosages):    Anesthesia method:  Local infiltration   Local anesthetic:  Lidocaine 1% WITH epi Procedure type:    Complexity:  Complex Procedure details:    Needle aspiration: no     Incision types:  Stab incision   Incision depth:  Subcutaneous   Scalpel blade:  11   Wound management:  Probed and deloculated and irrigated with saline   Drainage:  Bloody and purulent   Drainage amount:  Moderate   Wound treatment:  Wound left open   Packing materials:  None Post-procedure details:    Patient tolerance of procedure:  Tolerated well, no immediate complications Comments:     Incision and drainage performed with bedside ultrasound.  Patient had a loculated fluid collection consistent with abscess identified on bedside ultrasound.  Area was marked, cleansed, opened  with an 11 blade. Moderate purulent drainage. Wound dressed but left open. Superficial and does not need drain or packing      Medications  lidocaine-EPINEPHrine (XYLOCAINE W/EPI) 1 %-1:100000 (with pres) injection 10 mL (has no administration in time range)     ____________________________________________   INITIAL IMPRESSION / ASSESSMENT AND PLAN / ED COURSE  Pertinent labs & imaging results that were available during my care of the patient were reviewed by me and considered in my medical decision making (see chart for details).  Review of the [place] CSRS was performed in accordance of the [facility] prior to dispensing any controlled drugs.           Patient's diagnosis is consistent with right-sided chest wall abscess.  Patient presented to emergency department complaining of an abscess mid axillary line right side.  Patient had findings consistent with cellulitis and likely abscess.  Using bedside ultrasound, area was  incised and drained with moderate drainage.  Patient tolerated well.  Patient will be prescribed Bactrim, limited Norco for pain.  Follow-up with primary care as needed..  Patient is given ED precautions to return to the ED for any worsening or new symptoms.     ____________________________________________  FINAL CLINICAL IMPRESSION(S) / ED DIAGNOSES  Final diagnoses:  Abscess      NEW MEDICATIONS STARTED DURING THIS VISIT:  ED Discharge Orders         Ordered    sulfamethoxazole-trimethoprim (BACTRIM DS) 800-160 MG tablet  2 times daily     01/23/20 1645    HYDROcodone-acetaminophen (NORCO/VICODIN) 5-325 MG tablet  Every 4 hours PRN     01/23/20 1645              This chart was dictated using voice recognition software/Dragon. Despite best efforts to proofread, errors can occur which can change the meaning. Any change was purely unintentional.    Racheal Patches, PA-C 01/23/20 1646    Phineas Semen, MD 01/23/20 [phone]

## 2020-01-23 NOTE — ED Triage Notes (Signed)
Pt reports a spider bite to right mid axillary. Pt has area that appears like abscess with induration.  No fevers. No drainage. Has factor 5 deficiency, takes ASA; denies problems with stopping bleeding.

## 2020-05-10 ENCOUNTER — Emergency Department: Payer: BC Managed Care – PPO

## 2020-05-10 ENCOUNTER — Other Ambulatory Visit: Payer: Self-pay

## 2020-05-10 ENCOUNTER — Emergency Department
Admission: EM | Admit: 2020-05-10 | Discharge: 2020-05-10 | Disposition: A | Discharge status: Home / Self Care | Payer: BC Managed Care – PPO | Attending: Emergency Medicine | Admitting: Emergency Medicine

## 2020-05-10 DIAGNOSIS — R1013 Epigastric pain: Secondary | ICD-10-CM

## 2020-05-10 DIAGNOSIS — K8020 Calculus of gallbladder without cholecystitis without obstruction: Secondary | ICD-10-CM | POA: Insufficient documentation

## 2020-05-10 DIAGNOSIS — R109 Unspecified abdominal pain: Secondary | ICD-10-CM | POA: Diagnosis present

## 2020-05-10 DIAGNOSIS — Z7982 Long term (current) use of aspirin: Secondary | ICD-10-CM | POA: Diagnosis not present

## 2020-05-10 LAB — COMPREHENSIVE METABOLIC PANEL
ALT: 20 U/L (ref 0–44)
AST: 21 U/L (ref 15–41)
Albumin: 4.3 g/dL (ref 3.5–5.0)
Alkaline Phosphatase: 60 U/L (ref 38–126)
Anion gap: 9 (ref 5–15)
BUN: 15 mg/dL (ref 6–20)
CO2: 26 mmol/L (ref 22–32)
Calcium: 8.8 mg/dL — ABNORMAL LOW (ref 8.9–10.3)
Chloride: 105 mmol/L (ref 98–111)
Creatinine, Ser: 0.69 mg/dL (ref 0.44–1.00)
GFR calc Af Amer: >60 mL/min (ref >60)
GFR calc non Af Amer: >60 mL/min (ref >60)
Glucose, Bld: 123 mg/dL — ABNORMAL HIGH (ref 70–99)
Potassium: 3.9 mmol/L (ref 3.5–5.1)
Sodium: 140 mmol/L (ref 135–145)
Total Bilirubin: 0.8 mg/dL (ref 0.3–1.2)
Total Protein: 7.4 g/dL (ref 6.5–8.1)

## 2020-05-10 LAB — URINALYSIS, COMPLETE (UACMP) WITH MICROSCOPIC
Bacteria, UA: NONE SEEN
Bilirubin Urine: NEGATIVE
Glucose, UA: NEGATIVE mg/dL
Hgb urine dipstick: NEGATIVE
Ketones, ur: NEGATIVE mg/dL
Nitrite: NEGATIVE
Protein, ur: NEGATIVE mg/dL
Specific Gravity, Urine: 1.032 — ABNORMAL HIGH (ref 1.005–1.030)
pH: 5 (ref 5.0–8.0)

## 2020-05-10 LAB — CBC
HCT: 39.5 % (ref 36.0–46.0)
Hemoglobin: 13.3 g/dL (ref 12.0–15.0)
MCH: 31.4 pg (ref 26.0–34.0)
MCHC: 33.7 g/dL (ref 30.0–36.0)
MCV: 93.4 fL (ref 80.0–100.0)
Platelets: 328 10*3/uL (ref 150–400)
RBC: 4.23 MIL/uL (ref 3.87–5.11)
RDW: 13.2 % (ref 11.5–15.5)
WBC: 5.8 10*3/uL (ref 4.0–10.5)
nRBC: 0 % (ref 0.0–0.2)

## 2020-05-10 LAB — TROPONIN I (HIGH SENSITIVITY)
Troponin I (High Sensitivity): 2 ng/L (ref <18)
Troponin I (High Sensitivity): 4 ng/L (ref <18)

## 2020-05-10 LAB — POCT PREGNANCY, URINE: Preg Test, Ur: NEGATIVE

## 2020-05-10 LAB — LIPASE, BLOOD: Lipase: 39 U/L (ref 11–51)

## 2020-05-10 MED ORDER — ONDANSETRON HCL 4 MG/2ML IJ SOLN
4.0000 mg | Freq: Once | INTRAMUSCULAR | Status: AC
  Administered 2020-05-10: 4 mg via INTRAVENOUS
  Filled 2020-05-10: qty 2

## 2020-05-10 MED ORDER — PANTOPRAZOLE SODIUM 20 MG PO TBEC
20.0000 mg | DELAYED_RELEASE_TABLET | Freq: Every day | ORAL | 0 refills | Status: AC
Start: 2020-05-10 — End: 2020-06-09

## 2020-05-10 MED ORDER — ONDANSETRON 4 MG PO TBDP
4.0000 mg | ORAL_TABLET | Freq: Three times a day (TID) | ORAL | 0 refills | Status: AC | PRN
Start: 2020-05-10 — End: ?

## 2020-05-10 MED ORDER — PANTOPRAZOLE SODIUM 40 MG IV SOLR
40.0000 mg | Freq: Once | INTRAVENOUS | Status: AC
  Administered 2020-05-10: 40 mg via INTRAVENOUS
  Filled 2020-05-10: qty 40

## 2020-05-10 MED ORDER — SODIUM CHLORIDE 0.9 % IV BOLUS
1000.0000 mL | Freq: Once | INTRAVENOUS | Status: AC
  Administered 2020-05-10: 1000 mL via INTRAVENOUS

## 2020-05-10 MED ORDER — LIDOCAINE VISCOUS HCL 2 % MT SOLN
15.0000 mL | Freq: Once | OROMUCOSAL | Status: AC
  Administered 2020-05-10: 15 mL via ORAL
  Filled 2020-05-10: qty 15

## 2020-05-10 MED ORDER — ALUM & MAG HYDROXIDE-SIMETH 200-200-20 MG/5ML PO SUSP
30.0000 mL | Freq: Once | ORAL | Status: AC
  Administered 2020-05-10: 30 mL via ORAL
  Filled 2020-05-10: qty 30

## 2020-05-10 NOTE — ED Notes (Signed)
EDP [place]ke at bedside.  

## 2020-05-10 NOTE — ED Provider Notes (Addendum)
[facility] Emergency Department Provider Note  ____________________________________________   First MD Initiated Contact with Patient 05/10/20 [phone]     (approximate)  I have reviewed the triage vital signs and the nursing notes.   HISTORY  Chief Complaint Abdominal Pain    HPI Sophia Reeves is a 49 y.o. 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  Patient reports pain in the middle of her abdomen that started around 530 this morning.  The pain is been constant, severe, nothing makes it better including Tylenol, nothing makes it worse. Occasionally radiates around the the left side of abdomen.  States that she is had 5-6 episodes of vomit that was yellowish in color.  Denies any blood.  Still having bowel movements and passing gas.  Reports a tubal ligation but no other prior surgeries on her abdomen.  Denies any pain in her chest or shortness of breath.  Denies any eating anything different than normal.  Denies any loss sick at home.  Denies any dysuria.          Past Medical History:  Diagnosis Date   Factor V deficiency (HCC)    Migraine     Patient Active Problem List   Diagnosis Date Noted   Cerebral thrombosis with cerebral infarction (HCC) 06/06/2015   Numbness and tingling in right hand 06/05/2015    Past Surgical History:  Procedure Laterality Date   TUBAL LIGATION      Prior to Admission medications   Medication Sig Start Date End Date Taking? Authorizing Provider  aspirin EC 325 MG tablet Take 1 tablet (325 mg total) by mouth daily. 06/09/15   Emily FilbertWilliams, Jonathan E, MD  HYDROcodone-acetaminophen (NORCO/VICODIN) 5-325 MG tablet Take 1 tablet by mouth every 4 (four) hours as needed for moderate pain. 01/23/20   Cuthriell, Delorise RoyalsJonathan D, PA-C  sulfamethoxazole-trimethoprim (BACTRIM DS) 800-160 MG tablet Take 1 tablet by mouth 2 (two) times daily. 01/23/20   Cuthriell, Delorise RoyalsJonathan D, PA-C  topiramate (TOPAMAX)  50 MG tablet Take 1 tablet (50 mg total) by mouth 2 (two) times daily. 06/09/15 06/08/16  Emily FilbertWilliams, Jonathan E, MD    Allergies Patient has no known allergies.  Family History  Problem Relation Age of Onset   CAD Father    Diabetes Mother     Social History Social History   Tobacco Use   Smoking status: Never Smoker   Smokeless tobacco: Never Used  Substance Use Topics   Alcohol use: No    Alcohol/week: 0.0 standard drinks   Drug use: No      Review of Systems Constitutional: No fever/chills Eyes: No visual changes. ENT: No sore throat. Cardiovascular: Denies chest pain. Respiratory: Denies shortness of breath. Gastrointestinal: Positive epigastric abdominal pain with nausea, vomiting Genitourinary: Negative for dysuria. Musculoskeletal: Negative for back pain. Skin: Negative for rash. Neurological: Negative for headaches, focal weakness or numbness. All other ROS negative ____________________________________________   PHYSICAL EXAM:  VITAL SIGNS: ED Triage Vitals  Enc Vitals Group     BP 05/10/20 0719 (!) 134/94     Pulse Rate 05/10/20 0719 84     Resp 05/10/20 0719 17     Temp 05/10/20 0719 98.6 F (37 C)     Temp Source 05/10/20 0719 Oral     SpO2 05/10/20 0719 98 %     Weight 05/10/20 0708 220 lb (99.8 kg)     Height 05/10/20 0708 5\' 4"  (1.626 m)     Head Circumference --  Peak Flow --      Pain Score 05/10/20 0708 10     Pain Loc --      Pain Edu? --      Excl. in [place]? --     Constitutional: Alert and oriented. Well appearing and in no acute distress. Eyes: Conjunctivae are normal. EOMI. Head: Atraumatic. Nose: No congestion/rhinnorhea. Mouth/Throat: Mucous membranes are moist.   Neck: No stridor. Trachea Midline. FROM Cardiovascular: Normal rate, regular rhythm. Grossly normal heart sounds.  Good peripheral circulation. Respiratory: Normal respiratory effort.  No retractions. Lungs CTAB. Gastrointestinal: Soft with tenderness in the  mid abdomen.  A little bit of tenderness on the left side as well.  No distention. No abdominal bruits.  Musculoskeletal: No lower extremity tenderness nor edema.  No joint effusions. Neurologic:  Normal speech and language. No gross focal neurologic deficits are appreciated.  Skin:  Skin is warm, dry and intact. No rash noted. Psychiatric: Mood and affect are normal. Speech and behavior are normal. GU: Deferred   ____________________________________________   LABS (all labs ordered are listed, but only abnormal results are displayed)  Labs Reviewed  COMPREHENSIVE METABOLIC PANEL - Abnormal; Notable for the following components:      Result Value   Glucose, Bld 123 (*)    Calcium 8.8 (*)    All other components within normal limits  URINALYSIS, COMPLETE (UACMP) WITH MICROSCOPIC - Abnormal; Notable for the following components:   Color, Urine YELLOW (*)    APPearance CLOUDY (*)    Specific Gravity, Urine 1.032 (*)    Leukocytes,Ua MODERATE (*)    All other components within normal limits  CBC  LIPASE, BLOOD  POC URINE PREG, ED  POCT PREGNANCY, URINE  TROPONIN I (HIGH SENSITIVITY)  TROPONIN I (HIGH SENSITIVITY)   ____________________________________________   ED ECG REPORT I, Concha Se, the attending physician, personally viewed and interpreted this ECG.  Normal sinus rate of 76, no ST elevation, no T wave inversions, normal intervals ____________________________________________  RADIOLOGY   Official radiology report(s): US ABDOMEN LIMITED RUQ  Result Date: 05/10/2020 CLINICAL DATA:  Epigastric abdominal pain. Additional history provided by technologist: Patient reports epigastric abdominal pain since 5:30 a.m. EXAM: ULTRASOUND ABDOMEN LIMITED RIGHT UPPER QUADRANT COMPARISON:  Abdominal ultrasound 04/29/2014. FINDINGS: Gallbladder: There is cholecystolithiasis. No gallbladder wall thickening is visualized. No sonographic Eulah Pont sign is elicited by the scanning  technologist. Common bile duct: Diameter: 3-4 mm, within normal limits. Liver: No focal lesion identified. Generalized increased hepatic parenchymal echogenicity. Portal vein is patent on color Doppler imaging with normal direction of blood flow towards the liver. IMPRESSION: Cholecystolithiasis without sonographic evidence of acute cholecystitis. Hyperechogenicity of the hepatic parenchyma. This is a nonspecific finding, which may be seen in the setting of hepatic steatosis or other chronic hepatic parenchymal disease. Electronically Signed   By: Jackey Loge DO   On: 05/10/2020 10:36    ____________________________________________   PROCEDURES  Procedure(s) performed (including Critical Care):  Procedures   ____________________________________________   INITIAL IMPRESSION / ASSESSMENT AND PLAN / ED COURSE  Sophia Reeves was evaluated in Emergency Department on 05/10/2020 for the symptoms described in the history of present illness. She was evaluated in the context of the global COVID-19 pandemic, which necessitated consideration that the patient might be at risk for infection with the SARS-CoV-2 virus that causes COVID-19. Institutional protocols and algorithms that pertain to the evaluation of patients at risk for COVID-19 are in a state of rapid change based on information released by  regulatory bodies including the CDC and federal and state organizations. These policies and algorithms were followed during the patient's care in the ED.    Patient is a 49 year old who comes in with nausea, vomiting, epigastric abdominal pain.  Will get labs to evaluate for electrolyte abnormalities, AKI.  Lipase evaluate for pancreatitis, liver function test to evaluate for choledocholithiasis or gallbladder pathology.  Will get started with an ultrasound to rule out gallstones.  Will give some symptomatic treatment for possible gastritis.  She denies any smoking, alcohol, NSAID use.  We will give some IV fluid,  Zofran, GI cocktail, Protonix and reevaluate after ultrasound. If continues to have pain consider CT scan.   Concern for the possibility of UTI with 11-20 WBCs moderate leuks. Denies any symptoms of UTI.  Will hold off on antibiotics at this time. Cardiac markers negative Pregnancy test negative No signs of anemia No signs of dehydration  US-provided copy of results to patient.   Reevaluated patient.  Patient states that she has a history of factor V Leiden but she is not on any blood thinners.  She denies ever having a blood clot before.  She states that she is only had superficial blood clots and at one point was on aspirin only.  I discussed with patient that if she continued to have any pain that we consider a CT scan to rule out splenic infarct/clot given her history of factor V.  Patient states that her pain is completely resolved at this time would like to hold off on CT scan.  She states that as soon as she got the Zofran and the PPI her symptoms went away.  This seems less likely than to be secondary to splenic infarct or clot given resolution of pain with above.    I discussed with patient that her ultrasound does show signs of gallstones although this typically would not cause pain on the left side of her abdomen it could cause epigastric pain. we discussed a healthy diet in regards to this.  We will give her surgery follow-up for outpatient.  We will start her on PPI and Zofran.  We discussed avoiding NSAIDs and return to the ER if her symptoms are worsening or she has other concerns  I discussed the provisional nature of ED diagnosis, the treatment so far, the ongoing plan of care, follow up appointments and return precautions with the patient and any family or support people present. They expressed understanding and agreed with the plan, discharged home.     ____________________________________________   FINAL CLINICAL IMPRESSION(S) / ED DIAGNOSES   Final diagnoses:    Epigastric abdominal pain  Gallstones      MEDICATIONS GIVEN DURING THIS VISIT:  Medications  sodium chloride 0.9 % bolus 1,000 mL (1,000 mLs Intravenous New Bag/Given 05/10/20 1050)  ondansetron (ZOFRAN) injection 4 mg (4 mg Intravenous Given 05/10/20 1053)  pantoprazole (PROTONIX) injection 40 mg (40 mg Intravenous Given 05/10/20 1058)  alum & mag hydroxide-simeth (MAALOX/MYLANTA) 200-200-20 MG/5ML suspension 30 mL (30 mLs Oral Given 05/10/20 1058)    And  lidocaine (XYLOCAINE) 2 % viscous mouth solution 15 mL (15 mLs Oral Given 05/10/20 1058)     ED Discharge Orders         Ordered    pantoprazole (PROTONIX) 20 MG tablet  Daily     Discontinue  Reprint     05/10/20 1127    ondansetron (ZOFRAN ODT) 4 MG disintegrating tablet  Every 8 hours PRN  Discontinue  Reprint     05/10/20 1127           Note:  This document was prepared using Dragon voice recognition software and may include unintentional dictation errors.   Concha Se, MD 05/10/20 1130    Concha Se, MD 05/10/20 1130

## 2020-05-10 NOTE — ED Notes (Signed)
Pt resting in bed, bed locked and low, call light in reach, oriented to room and call bell.

## 2020-05-10 NOTE — ED Triage Notes (Signed)
Pt in with co epigastric pain that started at 0530 this am, no hx of the same.

## 2020-05-10 NOTE — Discharge Instructions (Addendum)
Labs are reassuring.  Ultrasound as below.  Follow-up with surgery for the gallstones.  Take the acid reducer Zofran to help with nausea.  You could have some inflammation of your stomach called gastritis.  Do not take ibuprofen or NSAIDs.  Safe to take Tylenol 1 g every 8 hours.  If you develop worsening pain especially on the left side return to the ER or any other concerns   IMPRESSION: Cholecystolithiasis without sonographic evidence of acute cholecystitis.   Hyperechogenicity of the hepatic parenchyma. This is a nonspecific finding, which may be seen in the setting of hepatic steatosis or other chronic hepatic parenchymal disease.

## 2022-05-26 ENCOUNTER — Other Ambulatory Visit: Payer: Self-pay | Admitting: Family Medicine

## 2022-05-26 DIAGNOSIS — Z1231 Encounter for screening mammogram for malignant neoplasm of breast: Secondary | ICD-10-CM

## 2022-05-28 ENCOUNTER — Ambulatory Visit
Admission: RE | Admit: 2022-05-28 | Discharge: 2022-05-28 | Disposition: A | Discharge status: Home / Self Care | Payer: Managed Care, Other (non HMO) | Source: Ambulatory Visit | Attending: Family Medicine | Admitting: Family Medicine

## 2022-05-28 DIAGNOSIS — Z1231 Encounter for screening mammogram for malignant neoplasm of breast: Secondary | ICD-10-CM | POA: Insufficient documentation

## 2023-02-05 ENCOUNTER — Emergency Department: Payer: Managed Care, Other (non HMO)

## 2023-02-05 ENCOUNTER — Other Ambulatory Visit: Payer: Self-pay

## 2023-02-05 ENCOUNTER — Emergency Department
Admission: EM | Admit: 2023-02-05 | Discharge: 2023-02-05 | Disposition: A | Discharge status: Home / Self Care | Payer: Managed Care, Other (non HMO) | Attending: Emergency Medicine | Admitting: Emergency Medicine

## 2023-02-05 DIAGNOSIS — M25562 Pain in left knee: Secondary | ICD-10-CM | POA: Insufficient documentation

## 2023-02-05 MED ORDER — MELOXICAM 15 MG PO TABS: 15.0000 mg | ORAL_TABLET | Freq: Every day | ORAL | 0 refills | Status: AC

## 2023-02-05 MED ORDER — DICLOFENAC SODIUM 1 % EX GEL: 2.0000 g | Freq: Four times a day (QID) | CUTANEOUS | 0 refills | Status: AC

## 2023-02-05 NOTE — Discharge Instructions (Addendum)
Take meloxicam once daily for pain and inflammation. After you finish the meloxicam in 1 week, you can use Voltaren gel topically. Keep the knee elevated at night and apply ice as an anti-inflammatory.

## 2023-02-05 NOTE — ED Provider Notes (Signed)
[facility] Provider Note  Patient Contact: 3:59 PM (approximate)   History   Knee Pain   HPI  Sophia Reeves is a 52 y.o. female presents to the emergency department with acute left knee pain.  Patient denies falls or mechanisms of trauma.  She states that she has had this pain on and off for the past several months but she has noticed some more instability in her knee and states that it occasionally gives out on her.  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  No new falls today.  No abrasions or lacerations.  No erythema overlying the left knee.      Physical Exam   Triage Vital Signs: ED Triage Vitals  Enc Vitals Group     BP 02/05/23 1426 (!) 167/95     Pulse Rate 02/05/23 1426 100     Resp 02/05/23 1426 16     Temp 02/05/23 1426 98.3 F (36.8 C)     Temp src --      SpO2 02/05/23 1426 98 %     Weight 02/05/23 1427 215 lb (97.5 kg)     Height 02/05/23 1427 5\' 4"  (1.626 m)     Head Circumference --      Peak Flow --      Pain Score 02/05/23 1426 10     Pain Loc --      Pain Edu? --      Excl. in [place]? --     Most recent vital signs: Vitals:   02/05/23 1426 02/05/23 1520  BP: (!) 167/95 129/84  Pulse: 100 97  Resp: 16 16  Temp: 98.3 F (36.8 C)   SpO2: 98% 98%     General: Alert and in no acute distress. Eyes:  PERRL. EOMI. Head: No acute traumatic findings ENT:      Nose: No congestion/rhinnorhea.      Mouth/Throat: Mucous membranes are moist. Neck: No stridor. No cervical spine tenderness to palpation. Cardiovascular:  Good peripheral perfusion Respiratory: Normal respiratory effort without tachypnea or retractions. Lungs CTAB. Good air entry to the bases with no decreased or absent breath sounds. Gastrointestinal: Bowel sounds 4 quadrants. Soft and nontender to palpation. No guarding or rigidity. No palpable masses. No distention. No CVA tenderness. Musculoskeletal:  Full range of motion to all extremities.  Neurologic:  No gross focal neurologic deficits are appreciated.  Skin:   No rash noted   ED Results / Procedures / Treatments   Labs (all labs ordered are listed, but only abnormal results are displayed) Labs Reviewed - No data to display     RADIOLOGY  I personally viewed and evaluated these images as part of my medical decision making, as well as reviewing the written report by the radiologist.  ED Provider Interpretation: No acute bony abnormality.   PROCEDURES:  Critical Care performed: No  Procedures   MEDICATIONS ORDERED IN ED: Medications - No data to display   IMPRESSION / MDM / [place] / ED COURSE  I reviewed the triage vital signs and the nursing notes.                              Assessment and plan Knee pain 52 year old female presents to the emergency department with acute on chronic left knee pain for the past 2 to 3 months.  Vital  signs are reassuring at triage.  On exam, patient was alert, active and nontoxic-appearing.  X-ray shows no acute bony abnormality  Patient was discharged with a 7-day course of meloxicam.  Advised patient to start Voltaren gel topically after she completes meloxicam course and to follow-up with orthopedics, Dr. Mack Guise.      FINAL CLINICAL IMPRESSION(S) / ED DIAGNOSES   Final diagnoses:  Acute pain of left knee     Rx / DC Orders   ED Discharge Orders          Ordered    meloxicam (MOBIC) 15 MG tablet  Daily        02/05/23 1515    diclofenac Sodium (VOLTAREN) 1 % GEL  4 times daily        02/05/23 1515             Note:  This document was prepared using Dragon voice recognition software and may include unintentional dictation errors.   Vallarie Mare [place], PA-C 02/05/23 1607    Duffy Bruce, MD 02/05/23 2207

## 2023-02-05 NOTE — ED Notes (Signed)
Pt states she has not taken any medications for pain.

## 2023-02-05 NOTE — ED Triage Notes (Signed)
Pt to ED for left knee pain that has been ongoing for past few months. States this week knee keeps giving out and will fall onto her knee.

## 2023-05-01 ENCOUNTER — Other Ambulatory Visit: Payer: Self-pay | Admitting: Family Medicine

## 2023-05-01 DIAGNOSIS — Z1231 Encounter for screening mammogram for malignant neoplasm of breast: Secondary | ICD-10-CM

## 2023-06-02 ENCOUNTER — Ambulatory Visit
Admission: RE | Admit: 2023-06-02 | Discharge: 2023-06-02 | Disposition: A | Discharge status: Home / Self Care | Payer: Managed Care, Other (non HMO) | Source: Ambulatory Visit | Attending: Family Medicine | Admitting: Family Medicine

## 2023-06-02 DIAGNOSIS — Z1231 Encounter for screening mammogram for malignant neoplasm of breast: Secondary | ICD-10-CM | POA: Diagnosis not present

## 2024-01-08 ENCOUNTER — Emergency Department
Admission: EM | Admit: 2024-01-08 | Discharge: 2024-01-08 | Disposition: A | Discharge status: Home / Self Care | Payer: BC Managed Care – PPO | Attending: Emergency Medicine | Admitting: Emergency Medicine

## 2024-01-08 ENCOUNTER — Other Ambulatory Visit: Payer: Self-pay

## 2024-01-08 ENCOUNTER — Emergency Department: Payer: BC Managed Care – PPO

## 2024-01-08 DIAGNOSIS — R059 Cough, unspecified: Secondary | ICD-10-CM | POA: Diagnosis not present

## 2024-01-08 DIAGNOSIS — R0789 Other chest pain: Secondary | ICD-10-CM | POA: Insufficient documentation

## 2024-01-08 DIAGNOSIS — R079 Chest pain, unspecified: Secondary | ICD-10-CM | POA: Diagnosis present

## 2024-01-08 LAB — HEPATIC FUNCTION PANEL
ALT: 16 U/L (ref 0–44)
AST: 19 U/L (ref 15–41)
Albumin: 3.8 g/dL (ref 3.5–5.0)
Alkaline Phosphatase: 64 U/L (ref 38–126)
Bilirubin, Direct: 0.1 mg/dL (ref 0.0–0.2)
Indirect Bilirubin: 0.9 mg/dL (ref 0.3–0.9)
Total Bilirubin: 1 mg/dL (ref 0.0–1.2)
Total Protein: 7 g/dL (ref 6.5–8.1)

## 2024-01-08 LAB — BASIC METABOLIC PANEL
Anion gap: 9 (ref 5–15)
BUN: 11 mg/dL (ref 6–20)
CO2: 25 mmol/L (ref 22–32)
Calcium: 8.9 mg/dL (ref 8.9–10.3)
Chloride: 104 mmol/L (ref 98–111)
Creatinine, Ser: 0.58 mg/dL (ref 0.44–1.00)
GFR, Estimated: >60 mL/min (ref >60)
Glucose, Bld: 124 mg/dL — ABNORMAL HIGH (ref 70–99)
Potassium: 4.2 mmol/L (ref 3.5–5.1)
Sodium: 138 mmol/L (ref 135–145)

## 2024-01-08 LAB — CBC
HCT: 40.6 % (ref 36.0–46.0)
Hemoglobin: 13.6 g/dL (ref 12.0–15.0)
MCH: 31.1 pg (ref 26.0–34.0)
MCHC: 33.5 g/dL (ref 30.0–36.0)
MCV: 92.9 fL (ref 80.0–100.0)
Platelets: 332 10*3/uL (ref 150–400)
RBC: 4.37 MIL/uL (ref 3.87–5.11)
RDW: 13.2 % (ref 11.5–15.5)
WBC: 7.3 10*3/uL (ref 4.0–10.5)
nRBC: 0 % (ref 0.0–0.2)

## 2024-01-08 LAB — LIPASE, BLOOD: Lipase: 32 U/L (ref 11–51)

## 2024-01-08 LAB — TROPONIN I (HIGH SENSITIVITY): Troponin I (High Sensitivity): 3 ng/L (ref <18)

## 2024-01-08 MED ORDER — KETOROLAC TROMETHAMINE 15 MG/ML IJ SOLN
15.0000 mg | Freq: Once | INTRAMUSCULAR | Status: AC
  Administered 2024-01-08: 15 mg via INTRAMUSCULAR
  Filled 2024-01-08: qty 1

## 2024-01-08 NOTE — ED Triage Notes (Signed)
 C?o left sided chest pain this morning.  States pain radiates to left shoulder and down back.  Alos non productive cough x 2 weeks.  Patient had dlu 2 weeks ago.   AAOx3.  Skin warm and dry. NAD. Dry cough noted. No SOB/ DOE.

## 2024-01-08 NOTE — ED Provider Notes (Signed)
 [facility] Provider Note    Event Date/Time   First MD Initiated Contact with Patient 01/08/24 [phone]     (approximate)   History   Chief Complaint: Chest Pain   HPI  Sophia Reeves is a 53 y.o. female with a past history of migraine, stroke who comes ED complaining of pain in the left side of the chest radiating to the shoulder.  Also has a nonproductive cough for the past 2 weeks since having the flu.  Denies shortness of breath, no fever or chills.  Not exertional, not pleuritic.          Physical Exam   Triage Vital Signs: ED Triage Vitals  Encounter Vitals Group     BP 01/08/24 0745 (!) 133/100     Systolic BP Percentile --      Diastolic BP Percentile --      Pulse Rate 01/08/24 0745 (!) 105     Resp 01/08/24 0745 20     Temp 01/08/24 0745 98 F (36.7 C)     Temp Source 01/08/24 0745 Oral     SpO2 01/08/24 0745 95 %     Weight 01/08/24 0743 214 lb 15.2 oz (97.5 kg)     Height --      Head Circumference --      Peak Flow --      Pain Score 01/08/24 0743 10     Pain Loc --      Pain Education --      Exclude from Growth Chart --     Most recent vital signs: Vitals:   01/08/24 0745 01/08/24 0900  BP: (!) 133/100 (!) 122/92  Pulse: (!) 105 98  Resp: 20 (!) 33  Temp: 98 F (36.7 C)   SpO2: 95% 99%    General: Awake, no distress.  CV:  Good peripheral perfusion.  Regular rate rhythm, normal distal pulses Resp:  Normal effort.  Clear to auscultation bilaterally Abd:  No distention.  Soft nontender Other:  Moist oral mucosa.  Left sternal margin is exquisitely tender to the touch reproducing her chest pain.  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   ED Results / Procedures / Treatments   Labs (all labs ordered are listed, but only abnormal results are displayed) Labs Reviewed  BASIC METABOLIC PANEL - Abnormal; Notable for the following components:      Result Value   Glucose, Bld 124 (*)    All other components within  normal limits  CBC  HEPATIC FUNCTION PANEL  LIPASE, BLOOD  TROPONIN I (HIGH SENSITIVITY)     EKG Interpreted by me Normal sinus rhythm rate of 92.  Normal axis, normal intervals.  Poor R wave progression.  Normal ST segments and T waves.  No ischemic changes.   RADIOLOGY Chest x-ray interpreted by me, normal.  Radiology report reviewed   PROCEDURES:  Procedures   MEDICATIONS ORDERED IN ED: Medications  ketorolac (TORADOL) 15 MG/ML injection 15 mg (15 mg Intramuscular Given 01/08/24 0844)     IMPRESSION / MDM / ASSESSMENT AND PLAN / ED COURSE  I reviewed the triage vital signs and the nursing notes.  DDx: Pneumothorax, myocarditis, NSTEMI, chest wall strain, pleural effusion  Patient's presentation is most consistent with acute presentation with potential threat to life or bodily function.  Patient comes to the ED due to chest pain.  On exam this is found to be musculoskeletal.  Vital signs and the rest of the exam are reassuring.  EKG chest x-ray and labs are all normal.  Recommend NSAIDs, heat therapy, stable for discharge.       FINAL CLINICAL IMPRESSION(S) / ED DIAGNOSES   Final diagnoses:  Chest wall pain     Rx / DC Orders   ED Discharge Orders     None        Note:  This document was prepared using Dragon voice recognition software and may include unintentional dictation errors.   Sharman Cheek, MD 01/08/24 [phone]

## 2024-05-02 ENCOUNTER — Other Ambulatory Visit: Payer: Self-pay | Admitting: Family Medicine

## 2024-05-02 DIAGNOSIS — Z1231 Encounter for screening mammogram for malignant neoplasm of breast: Secondary | ICD-10-CM

## 2024-05-27 ENCOUNTER — Emergency Department

## 2024-05-27 ENCOUNTER — Other Ambulatory Visit: Payer: Self-pay

## 2024-05-27 ENCOUNTER — Emergency Department
Admission: EM | Admit: 2024-05-27 | Discharge: 2024-05-27 | Disposition: A | Discharge status: Home / Self Care | Attending: Emergency Medicine | Admitting: Emergency Medicine

## 2024-05-27 DIAGNOSIS — M549 Dorsalgia, unspecified: Secondary | ICD-10-CM | POA: Diagnosis present

## 2024-05-27 DIAGNOSIS — M5442 Lumbago with sciatica, left side: Secondary | ICD-10-CM | POA: Diagnosis not present

## 2024-05-27 MED ORDER — CYCLOBENZAPRINE HCL 10 MG PO TABS
10.0000 mg | ORAL_TABLET | Freq: Once | ORAL | Status: AC
  Administered 2024-05-27: 10 mg via ORAL
  Filled 2024-05-27: qty 1

## 2024-05-27 MED ORDER — MELOXICAM 15 MG PO TABS: 15.0000 mg | ORAL_TABLET | Freq: Every day | ORAL | 2 refills | Status: AC

## 2024-05-27 MED ORDER — PREDNISONE 10 MG (21) PO TBPK: ORAL_TABLET | ORAL | 0 refills | Status: AC

## 2024-05-27 MED ORDER — KETOROLAC TROMETHAMINE 15 MG/ML IJ SOLN
15.0000 mg | Freq: Once | INTRAMUSCULAR | Status: AC
  Administered 2024-05-27: 15 mg via INTRAMUSCULAR
  Filled 2024-05-27: qty 1

## 2024-05-27 MED ORDER — OXYCODONE-ACETAMINOPHEN 5-325 MG PO TABS
1.0000 | ORAL_TABLET | Freq: Once | ORAL | Status: AC
  Administered 2024-05-27: 1 via ORAL
  Filled 2024-05-27: qty 1

## 2024-05-27 MED ORDER — LIDOCAINE 5 % EX PTCH
1.0000 | MEDICATED_PATCH | Freq: Once | CUTANEOUS | Status: DC
  Administered 2024-05-27: 1 via TRANSDERMAL
  Filled 2024-05-27: qty 1

## 2024-05-27 MED ORDER — BACLOFEN 10 MG PO TABS: 10.0000 mg | ORAL_TABLET | Freq: Three times a day (TID) | ORAL | 0 refills | Status: AC

## 2024-05-27 NOTE — ED Triage Notes (Signed)
 Patient states low back pain that radiates down left leg; started a couple of hours ago while bending over.

## 2024-05-27 NOTE — ED Provider Notes (Signed)
 [facility] Provider Note    Event Date/Time   First MD Initiated Contact with Patient 05/27/24 1341     (approximate)   History   Back Pain   HPI  Sophia Reeves is a 53 y.o. female history of migraines, factor V presents emergency department with back pain radiates down left leg.  States she has history of scoliosis.  Patient states that the pain started today when she tripped and bent over.  Numbness and tingling go down the left leg.  No loss of bowel or bladder control.  No difficulty walking.  Has not taking any medications.  States her employer told her she needed to come to the ED      Physical Exam   Triage Vital Signs: ED Triage Vitals  Encounter Vitals Group     BP 05/27/24 1152 (!) 129/107     Girls Systolic BP Percentile --      Girls Diastolic BP Percentile --      Boys Systolic BP Percentile --      Boys Diastolic BP Percentile --      Pulse Rate 05/27/24 1152 96     Resp 05/27/24 1152 17     Temp 05/27/24 1152 98.6 F (37 C)     Temp Source 05/27/24 1152 Oral     SpO2 05/27/24 1152 96 %     Weight 05/27/24 1153 215 lb (97.5 kg)     Height 05/27/24 1153 5' 4 (1.626 m)     Head Circumference --      Peak Flow --      Pain Score 05/27/24 1155 10     Pain Loc --      Pain Education --      Exclude from Growth Chart --     Most recent vital signs: Vitals:   05/27/24 1152  BP: (!) 129/107  Pulse: 96  Resp: 17  Temp: 98.6 F (37 C)  SpO2: 96%     General: Awake, no distress.   CV:  Good peripheral perfusion.  Resp:  Normal effort. Abd:  No distention.   Other:  Lumbar spine tender to palpation, 5 or 5 strength lower extremities, neurovascular intact   ED Results / Procedures / Treatments   Labs (all labs ordered are listed, but only abnormal results are displayed) Labs Reviewed - No data to display   EKG     RADIOLOGY X-ray lumbar spine    PROCEDURES:   Procedures  Critical Care:  no Chief  Complaint  Patient presents with   Back Pain      MEDICATIONS ORDERED IN ED: Medications  lidocaine  (LIDODERM ) 5 % 1 patch (1 patch Transdermal Patch Applied 05/27/24 1417)  ketorolac  (TORADOL ) 15 MG/ML injection 15 mg (15 mg Intramuscular Given 05/27/24 1418)  cyclobenzaprine  (FLEXERIL ) tablet 10 mg (10 mg Oral Given 05/27/24 1417)  oxyCODONE -acetaminophen  (PERCOCET/ROXICET) 5-325 MG per tablet 1 tablet (1 tablet Oral Given 05/27/24 1417)     IMPRESSION / MDM / ASSESSMENT AND PLAN / ED COURSE  I reviewed the triage vital signs and the nursing notes.                              Differential diagnosis includes, but is not limited to, lumbar strain, lumbar fracture, bulging disc, sciatica  Patient's presentation is most consistent with acute illness / injury with system symptoms.   Medications given: Lidoderm  patch applied, Toradol  15  mg IM, Flexeril , and Percocet provided  X-ray lumbar spine  X-ray lumbar spine shows scoliosis, independently reviewed interpreted by me, no other acute abnormality noted  Patient is feeling better after the medications.  Will go ahead and discharge her to home.  Do not feel admission would benefit her.  Will start her on meloxicam  and baclofen.  However if she is not feeling better in 3 days she can switch from meloxicam  to Sterapred.  She is in agreement with this treatment plan.  Discharged stable condition.  Cautioned her not to take Sterapred meloxicam  at the same time as this is too much for her kidneys.  States she understands.         FINAL CLINICAL IMPRESSION(S) / ED DIAGNOSES   Final diagnoses:  Acute midline low back pain with left-sided sciatica     Rx / DC Orders   ED Discharge Orders          Ordered    meloxicam  (MOBIC ) 15 MG tablet  Daily        05/27/24 1510    baclofen (LIORESAL) 10 MG tablet  3 times daily        05/27/24 1510    predniSONE (STERAPRED UNI-PAK 21 TAB) 10 MG (21) TBPK tablet        05/27/24 1510              Note:  This document was prepared using Dragon voice recognition software and may include unintentional dictation errors.    Gasper Devere ORN, PA-C 05/27/24 1513    Claudene Rover, MD 05/27/24 [phone]

## 2024-06-02 ENCOUNTER — Ambulatory Visit
Admission: RE | Admit: 2024-06-02 | Discharge: 2024-06-02 | Disposition: A | Discharge status: Home / Self Care | Source: Ambulatory Visit | Attending: Family Medicine | Admitting: Family Medicine

## 2024-06-02 DIAGNOSIS — Z1231 Encounter for screening mammogram for malignant neoplasm of breast: Secondary | ICD-10-CM | POA: Insufficient documentation

## 2024-06-06 ENCOUNTER — Encounter: Payer: Self-pay | Admitting: Family Medicine

## 2024-06-07 ENCOUNTER — Other Ambulatory Visit: Payer: Self-pay | Admitting: Family Medicine

## 2024-06-07 DIAGNOSIS — R928 Other abnormal and inconclusive findings on diagnostic imaging of breast: Secondary | ICD-10-CM

## 2024-06-13 ENCOUNTER — Ambulatory Visit
Admission: RE | Admit: 2024-06-13 | Discharge: 2024-06-13 | Disposition: A | Discharge status: Home / Self Care | Source: Ambulatory Visit | Attending: Family Medicine | Admitting: Family Medicine

## 2024-06-13 DIAGNOSIS — R928 Other abnormal and inconclusive findings on diagnostic imaging of breast: Secondary | ICD-10-CM | POA: Diagnosis present
